# Patient Record
Sex: Male | Born: 1970 | Race: White | Hispanic: No | Marital: Married | Smoking: Never smoker
Health system: Southern US, Community
[De-identification: ages and names within clinical notes are randomized; demographics above are authoritative.]

## PROBLEM LIST (undated history)

## (undated) DIAGNOSIS — E785 Hyperlipidemia, unspecified: Secondary | ICD-10-CM

## (undated) DIAGNOSIS — F329 Major depressive disorder, single episode, unspecified: Secondary | ICD-10-CM

## (undated) DIAGNOSIS — F32A Depression, unspecified: Secondary | ICD-10-CM

## (undated) DIAGNOSIS — F419 Anxiety disorder, unspecified: Secondary | ICD-10-CM

## (undated) DIAGNOSIS — E119 Type 2 diabetes mellitus without complications: Secondary | ICD-10-CM

## (undated) DIAGNOSIS — G4730 Sleep apnea, unspecified: Secondary | ICD-10-CM

## (undated) DIAGNOSIS — K219 Gastro-esophageal reflux disease without esophagitis: Secondary | ICD-10-CM

## (undated) DIAGNOSIS — I10 Essential (primary) hypertension: Secondary | ICD-10-CM

## (undated) HISTORY — DX: Gastro-esophageal reflux disease without esophagitis: K21.9

## (undated) HISTORY — DX: Essential (primary) hypertension: I10

## (undated) HISTORY — DX: Anxiety disorder, unspecified: F41.9

## (undated) HISTORY — DX: Depression, unspecified: F32.A

## (undated) HISTORY — DX: Major depressive disorder, single episode, unspecified: F32.9

## (undated) HISTORY — DX: Type 2 diabetes mellitus without complications: E11.9

## (undated) HISTORY — DX: Hyperlipidemia, unspecified: E78.5

## (undated) HISTORY — PX: WISDOM TOOTH EXTRACTION: SHX21

## (undated) HISTORY — DX: Sleep apnea, unspecified: G47.30

---

## 1999-10-10 ENCOUNTER — Emergency Department (HOSPITAL_COMMUNITY): Admission: EM | Admit: 1999-10-10 | Discharge: 1999-10-11 | Payer: Self-pay | Admitting: Emergency Medicine

## 1999-11-01 ENCOUNTER — Encounter: Admission: RE | Admit: 1999-11-01 | Discharge: 2000-01-30 | Payer: Self-pay | Admitting: Family Medicine

## 1999-11-22 ENCOUNTER — Ambulatory Visit (HOSPITAL_COMMUNITY): Admission: RE | Admit: 1999-11-22 | Discharge: 1999-11-22 | Payer: Self-pay | Admitting: *Deleted

## 1999-11-22 ENCOUNTER — Encounter: Payer: Self-pay | Admitting: *Deleted

## 1999-11-29 ENCOUNTER — Ambulatory Visit (HOSPITAL_COMMUNITY): Admission: RE | Admit: 1999-11-29 | Discharge: 1999-11-29 | Payer: Self-pay | Admitting: *Deleted

## 2015-09-10 ENCOUNTER — Other Ambulatory Visit (HOSPITAL_COMMUNITY): Payer: Self-pay | Admitting: Surgery

## 2015-09-26 ENCOUNTER — Other Ambulatory Visit (HOSPITAL_COMMUNITY): Payer: Self-pay | Admitting: Surgery

## 2015-09-27 ENCOUNTER — Encounter: Payer: BLUE CROSS/BLUE SHIELD | Attending: Surgery | Admitting: Dietician

## 2015-09-27 ENCOUNTER — Encounter: Payer: Self-pay | Admitting: Dietician

## 2015-09-27 DIAGNOSIS — Z01818 Encounter for other preprocedural examination: Secondary | ICD-10-CM | POA: Insufficient documentation

## 2015-09-27 NOTE — Progress Notes (Signed)
  Pre-Op Assessment Visit:  Pre-Operative Sleeve gastrectomy Surgery  Medical Nutrition Therapy:  Appt start time: 0920   End time:  1005.  Patient was seen on 09/27/2015 for Pre-Operative Nutrition Assessment. Assessment and letter of approval [facility] to [facility] coordinator on 09/27/2015.   Preferred Learning Style:   No preference indicated   Learning Readiness:   Ready  Handouts given during visit include:  Pre-Op Goals Bariatric Surgery Protein Shakes   During the appointment today the following Pre-Op Goals were reviewed with the patient: Maintain or lose weight as instructed by your surgeon Make healthy food choices Begin to limit portion sizes Limited concentrated sugars and fried foods Keep fat/sugar in the single digits per serving on   food labels Practice CHEWING your food  (aim for 30 chews per bite or until applesauce consistency) Practice not drinking 15 minutes before, during, and 30 minutes after each meal/snack Avoid all carbonated beverages  Avoid/limit caffeinated beverages  Avoid all sugar-sweetened beverages Consume 3 meals per day; eat every 3-5 hours Make a list of non-food related activities Aim for 64-100 ounces of FLUID daily  Aim for at least 60-80 grams of PROTEIN daily Look for a liquid protein source that contain ?15 g protein and ?5 g carbohydrate  (ex: shakes, drinks, shots)   Demonstrated degree of understanding via:  Teach Back  Teaching Method Utilized:  Visual Auditory Hands on  Barriers to learning/adherence to lifestyle change: none  Patient to call the [facility] to enroll in Pre-Op and Post-Op Nutrition Education when surgery date is scheduled.

## 2015-10-16 ENCOUNTER — Ambulatory Visit (HOSPITAL_COMMUNITY): Payer: BLUE CROSS/BLUE SHIELD

## 2015-10-16 ENCOUNTER — Ambulatory Visit (HOSPITAL_COMMUNITY)
Admission: RE | Admit: 2015-10-16 | Discharge: 2015-10-16 | Disposition: A | Discharge status: Home / Self Care | Payer: BLUE CROSS/BLUE SHIELD | Source: Ambulatory Visit | Attending: Surgery | Admitting: Surgery

## 2015-10-22 ENCOUNTER — Encounter: Payer: BLUE CROSS/BLUE SHIELD | Attending: Surgery

## 2015-10-22 DIAGNOSIS — Z01818 Encounter for other preprocedural examination: Secondary | ICD-10-CM | POA: Diagnosis not present

## 2015-10-24 NOTE — Progress Notes (Signed)
  Pre-Operative Nutrition Class:  Appt start time: 0786   End time:  1830.  Patient was seen on 10/22/2015 for Pre-Operative Bariatric Surgery Education at the [facility].   Surgery date: 11/12/2015 Surgery type: Sleeve gastrectomy Start weight at [facility]: 323 lbs on 09/27/2015 Weight today: 324.8 lbs  TANITA  BODY COMP RESULTS  10/22/15   BMI (kg/m^2) 46.6   Fat Mass (lbs) 144.8   Fat Free Mass (lbs) 180   Total Body Water (lbs) 143.2   Samples given per MNT protocol. Patient educated on appropriate usage: Bariatric Advantage Calcium Citrate chew (orange - qty 1) Lot #: [id number] Exp: 12/2015  Celebrate Vitamins Multivitamin (pineapple-strawberry - qty 1) Lot #: [id number] Exp: 04/2016  Premier protein shake (chocolate - qty 1) Lot #: [id number] Exp: 11/2015  Unjury Protein Powder (strawberry - qty 1) Lot #: [id number] Exp: 03/2016  The following the learning objectives were met by the patient during this course:  Identify Pre-Op Dietary Goals and will begin 2 weeks pre-operatively  Identify appropriate sources of fluids and proteins   State protein recommendations and appropriate sources pre and post-operatively  Identify Post-Operative Dietary Goals and will follow for 2 weeks post-operatively  Identify appropriate multivitamin and calcium sources  Describe the need for physical activity post-operatively and will follow MD recommendations  State when to call healthcare provider regarding medication questions or post-operative complications  Handouts given during class include:  Pre-Op Bariatric Surgery Diet Handout  Protein Shake Handout  Post-Op Bariatric Surgery Nutrition Handout  BELT Program Information Flyer  Support Group Information Flyer  [facility] Bariatric Supplements Price List  Follow-Up Plan: Patient will follow-up at [facility] 2 weeks post operatively for diet advancement per MD.

## 2015-11-06 ENCOUNTER — Ambulatory Visit: Payer: Self-pay | Admitting: Surgery

## 2015-11-08 ENCOUNTER — Ambulatory Visit: Payer: Self-pay | Admitting: Surgery

## 2015-11-08 ENCOUNTER — Encounter (HOSPITAL_COMMUNITY): Payer: Self-pay

## 2015-11-08 ENCOUNTER — Encounter (HOSPITAL_COMMUNITY)
Admission: RE | Admit: 2015-11-08 | Discharge: 2015-11-08 | Disposition: A | Discharge status: Home / Self Care | Payer: BLUE CROSS/BLUE SHIELD | Source: Ambulatory Visit | Attending: Surgery | Admitting: Surgery

## 2015-11-08 DIAGNOSIS — I10 Essential (primary) hypertension: Secondary | ICD-10-CM | POA: Diagnosis not present

## 2015-11-08 DIAGNOSIS — Z01818 Encounter for other preprocedural examination: Secondary | ICD-10-CM | POA: Insufficient documentation

## 2015-11-08 DIAGNOSIS — Z01812 Encounter for preprocedural laboratory examination: Secondary | ICD-10-CM | POA: Diagnosis not present

## 2015-11-08 DIAGNOSIS — Z6841 Body Mass Index (BMI) 40.0 and over, adult: Secondary | ICD-10-CM | POA: Insufficient documentation

## 2015-11-08 LAB — CBC WITH DIFFERENTIAL/PLATELET
BASOS ABS: 0 10*3/uL (ref 0.0–0.1)
Basophils Relative: 0 %
EOS PCT: 1 %
Eosinophils Absolute: 0.1 10*3/uL (ref 0.0–0.7)
HCT: 44.3 % (ref 39.0–52.0)
Hemoglobin: 15.2 g/dL (ref 13.0–17.0)
LYMPHS PCT: 28 %
Lymphs Abs: 1.8 10*3/uL (ref 0.7–4.0)
MCH: 30.8 pg (ref 26.0–34.0)
MCHC: 34.3 g/dL (ref 30.0–36.0)
MCV: 89.9 fL (ref 78.0–100.0)
Monocytes Absolute: 0.9 10*3/uL (ref 0.1–1.0)
Monocytes Relative: 14 %
NEUTROS PCT: 57 %
Neutro Abs: 3.7 10*3/uL (ref 1.7–7.7)
PLATELETS: 221 10*3/uL (ref 150–400)
RBC: 4.93 MIL/uL (ref 4.22–5.81)
RDW: 12.5 % (ref 11.5–15.5)
WBC: 6.5 10*3/uL (ref 4.0–10.5)

## 2015-11-08 NOTE — Patient Instructions (Addendum)
Martin Fox  11/08/2015   Your procedure is scheduled on: Monday 11/12/2015  Report to [facility]  Entrance take [name]  elevators to 3rd floor to  Short Stay Center at  0515  AM.  Call this number if you have problems the morning of surgery [phone]   Remember: ONLY 1 PERSON MAY GO WITH YOU TO SHORT STAY TO GET  READY MORNING OF YOUR SURGERY.   Do not eat food or drink liquids :After Midnight.     Take these medicines the morning of surgery with A SIP OF WATER: Lorazepam if needed              BRING CPAP MASK AND TUBING TO HOSPITAL MORNING OF SURGERY!                               You may not have any metal on your body including hair pins and              piercings  Do not wear jewelry, make-up, lotions, powders or perfumes, deodorant                          Men may shave face and neck.   Do not bring valuables to the hospital. [facility] IS NOT             RESPONSIBLE   FOR VALUABLES.  Contacts, dentures or bridgework may not be worn into surgery.  Leave suitcase in the car. After surgery it may be brought to your room.                   Please read over the following fact sheets you were given: _____________________________________________________________________             [facility] - Preparing for Surgery Before surgery, you can play an important role.  Because skin is not sterile, your skin needs to be as free of germs as possible.  You can reduce the number of germs on your skin by washing with CHG (chlorahexidine gluconate) soap before surgery.  CHG is an antiseptic cleaner which kills germs and bonds with the skin to continue killing germs even after washing. Please DO NOT use if you have an allergy to CHG or antibacterial soaps.  If your skin becomes reddened/irritated stop using the CHG and inform your nurse when you arrive at Short Stay. Do not shave (including legs and underarms) for at least 48 hours prior to the first CHG  shower.  You may shave your face/neck. Please follow these instructions carefully:  1.  Shower with CHG Soap the night before surgery and the  morning of Surgery.  2.  If you choose to wash your hair, wash your hair first as usual with your  normal  shampoo.  3.  After you shampoo, rinse your hair and body thoroughly to remove the  shampoo.                           4.  Use CHG as you would any other liquid soap.  You can apply chg directly  to the skin and wash  Gently with a scrungie or clean washcloth.  5.  Apply the CHG Soap to your body ONLY FROM THE NECK DOWN.   Do not use on face/ open                           Wound or open sores. Avoid contact with eyes, ears mouth and genitals (private parts).                       Wash face,  Genitals (private parts) with your normal soap.             6.  Wash thoroughly, paying special attention to the area where your surgery  will be performed.  7.  Thoroughly rinse your body with warm water from the neck down.  8.  DO NOT shower/wash with your normal soap after using and rinsing off  the CHG Soap.                9.  Pat yourself dry with a clean towel.            10.  Wear clean pajamas.            11.  Place clean sheets on your bed the night of your first shower and do not  sleep with pets. Day of Surgery : Do not apply any lotions/deodorants the morning of surgery.  Please wear clean clothes to the hospital/surgery center.  FAILURE TO FOLLOW THESE INSTRUCTIONS MAY RESULT IN THE CANCELLATION OF YOUR SURGERY PATIENT SIGNATURE_________________________________  NURSE SIGNATURE__________________________________  ________________________________________________________________________   Martin Fox  An incentive spirometer is a tool that can help keep your lungs clear and active. This tool measures how well you are filling your lungs with each breath. Taking long deep breaths may help reverse or decrease the chance  of developing breathing (pulmonary) problems (especially infection) following:  A long period of time when you are unable to move or be active. BEFORE THE PROCEDURE   If the spirometer includes an indicator to show your best effort, your nurse or respiratory therapist will set it to a desired goal.  If possible, sit up straight or lean slightly forward. Try not to slouch.  Hold the incentive spirometer in an upright position. INSTRUCTIONS FOR USE  1. Sit on the edge of your bed if possible, or sit up as far as you can in bed or on a chair. 2. Hold the incentive spirometer in an upright position. 3. Breathe out normally. 4. Place the mouthpiece in your mouth and seal your lips tightly around it. 5. Breathe in slowly and as deeply as possible, raising the piston or the ball toward the top of the column. 6. Hold your breath for 3-5 seconds or for as long as possible. Allow the piston or ball to fall to the bottom of the column. 7. Remove the mouthpiece from your mouth and breathe out normally. 8. Rest for a few seconds and repeat Steps 1 through 7 at least 10 times every 1-2 hours when you are awake. Take your time and take a few normal breaths between deep breaths. 9. The spirometer may include an indicator to show your best effort. Use the indicator as a goal to work toward during each repetition. 10. After each set of 10 deep breaths, practice coughing to be sure your lungs are clear. If you have an incision (the cut made at the time of surgery),  support your incision when coughing by placing a pillow or rolled up towels firmly against it. Once you are able to get out of bed, walk around indoors and cough well. You may stop using the incentive spirometer when instructed by your caregiver.  RISKS AND COMPLICATIONS  Take your time so you do not get dizzy or light-headed.  If you are in pain, you may need to take or ask for pain medication before doing incentive spirometry. It is harder to  take a deep breath if you are having pain. AFTER USE  Rest and breathe slowly and easily.  It can be helpful to keep track of a log of your progress. Your caregiver can provide you with a simple table to help with this. If you are using the spirometer at home, follow these instructions: [place] IF:   You are having difficultly using the spirometer.  You have trouble using the spirometer as often as instructed.  Your pain medication is not giving enough relief while using the spirometer.  You develop fever of 100.5 F (38.1 C) or higher. SEEK IMMEDIATE MEDICAL CARE IF:   You cough up bloody sputum that had not been present before.  You develop fever of 102 F (38.9 C) or greater.  You develop worsening pain at or near the incision site. MAKE SURE YOU:   Understand these instructions.  Will watch your condition.  Will get help right away if you are not doing well or get worse. Document Released: 09/08/2006 Document Revised: 07/21/2011 Document Reviewed: 11/09/2006 [facility] Patient Information 2014 [place], [place].   ________________________________________________________________________

## 2015-11-08 NOTE — Progress Notes (Signed)
11/06/2015- [facility] on chart (HgA1C, CMP, Lipid panel).

## 2015-11-08 NOTE — H&P (Signed)
Truddie CrumbleAshley D. Kuwahara Location: [facility] Patient #: [id number] DOB: 09/23/1970 Married / Language: English / Race: White Male  CC Morbid obesity  History of Present Illness  The patient is a 45 year old male who presents for a bariatric surgery evaluation. [name] and [name] came in today for a consultation. They have been to our seminar and were considering a sleeve and a bypass. We discussed sleeve gastrectomy and Roux-en-Y gastric bypass. He does drink liquids a lot when working. He also takes NSAIDS. He is a large man with centripetal obesity and a BMI of 46. They asked very good questions we had a long discussion about diet and lifestyle changes to have optimal success. [place] They have 3 sons.  He has tried a number of diets and always regains weight and has become very discouraged about that. Based on thorough discussion of his habits I think a sleeve gastrectomy would be best for him and he agrees. We will go ahead and pursue this.  His UGI was negative for hiatus hernia.  He was seen on the office June 29th for a preop visit.     Other Problems  Anxiety Disorder Back Pain Depression Diabetes Mellitus Diverticulosis Gastroesophageal Reflux Disease High blood pressure Hypercholesterolemia Migraine Headache Other disease, cancer, significant illness Sleep Apnea  Past Surgical History  Oral Surgery  Diagnostic Studies History  Colonoscopy >10 years ago  Allergies  No Known Drug Allergies04/27/2017  Social History  Alcohol use Occasional alcohol use. Caffeine use Carbonated beverages, Coffee, Tea. No drug use Tobacco use Never smoker.  Family History  Arthritis Mother. Migraine Headache Mother.    Review of Systems  General Present- Weight Gain. Not Present- Appetite Loss, Chills, Fatigue, Fever, Night Sweats and Weight Loss. HEENT Not Present- Earache, Hearing Loss, Hoarseness, Nose  Bleed, Oral Ulcers, Ringing in the Ears, Seasonal Allergies, Sinus Pain, Sore Throat, Visual Disturbances, Wears glasses/contact lenses and Yellow Eyes. Respiratory Present- Chronic Cough. Not Present- Bloody sputum, Difficulty Breathing, Snoring and Wheezing. Breast Not Present- Breast Mass, Breast Pain, Nipple Discharge and Skin Changes. Cardiovascular Not Present- Chest Pain, Difficulty Breathing Lying Down, Leg Cramps, Palpitations, Rapid Heart Rate, Shortness of Breath and Swelling of Extremities. Gastrointestinal Not Present- Abdominal Pain, Bloating, Bloody Stool, Change in Bowel Habits, Chronic diarrhea, Constipation, Difficulty Swallowing, Excessive gas, Gets full quickly at meals, Hemorrhoids, Indigestion, Nausea, Rectal Pain and Vomiting. Male Genitourinary Present- Frequency. Not Present- Blood in Urine, Change in Urinary Stream, Impotence, Nocturia, Painful Urination, Urgency and Urine Leakage. Musculoskeletal Present- Back Pain and Joint Stiffness. Not Present- Joint Pain, Muscle Pain, Muscle Weakness and Swelling of Extremities. Neurological Present- Decreased Memory. Not Present- Fainting, Headaches, Numbness, Seizures, Tingling, Tremor, Trouble walking and Weakness. Psychiatric Present- Anxiety. Not Present- Bipolar, Change in Sleep Pattern, Depression, Fearful and Frequent crying. Endocrine Present- New Diabetes. Not Present- Cold Intolerance, Excessive Hunger, Hair Changes, Heat Intolerance and Hot flashes.  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  Will proceed with lap sleeve gastrectomy.    Matt B. Daphine DeutscherMartin, MD, FACS

## 2015-11-12 ENCOUNTER — Encounter (HOSPITAL_COMMUNITY)
Admission: RE | Disposition: A | Discharge status: Home / Self Care | Payer: Self-pay | Source: Ambulatory Visit | Attending: Surgery

## 2015-11-12 ENCOUNTER — Inpatient Hospital Stay (HOSPITAL_COMMUNITY): Payer: BLUE CROSS/BLUE SHIELD | Admitting: Registered Nurse

## 2015-11-12 ENCOUNTER — Encounter (HOSPITAL_COMMUNITY): Payer: Self-pay | Admitting: *Deleted

## 2015-11-12 ENCOUNTER — Inpatient Hospital Stay (HOSPITAL_COMMUNITY)
Admission: RE | Admit: 2015-11-12 | Discharge: 2015-11-14 | DRG: 621 | Disposition: A | Discharge status: Home / Self Care | Payer: BLUE CROSS/BLUE SHIELD | Source: Ambulatory Visit | Attending: Surgery | Admitting: Surgery

## 2015-11-12 DIAGNOSIS — Z79899 Other long term (current) drug therapy: Secondary | ICD-10-CM

## 2015-11-12 DIAGNOSIS — F419 Anxiety disorder, unspecified: Secondary | ICD-10-CM | POA: Diagnosis present

## 2015-11-12 DIAGNOSIS — I10 Essential (primary) hypertension: Secondary | ICD-10-CM | POA: Diagnosis present

## 2015-11-12 DIAGNOSIS — F329 Major depressive disorder, single episode, unspecified: Secondary | ICD-10-CM | POA: Diagnosis present

## 2015-11-12 DIAGNOSIS — G4730 Sleep apnea, unspecified: Secondary | ICD-10-CM | POA: Diagnosis present

## 2015-11-12 DIAGNOSIS — E119 Type 2 diabetes mellitus without complications: Secondary | ICD-10-CM | POA: Diagnosis present

## 2015-11-12 DIAGNOSIS — Z6841 Body Mass Index (BMI) 40.0 and over, adult: Secondary | ICD-10-CM

## 2015-11-12 DIAGNOSIS — E118 Type 2 diabetes mellitus with unspecified complications: Secondary | ICD-10-CM | POA: Diagnosis present

## 2015-11-12 DIAGNOSIS — E7800 Pure hypercholesterolemia, unspecified: Secondary | ICD-10-CM | POA: Diagnosis present

## 2015-11-12 DIAGNOSIS — K219 Gastro-esophageal reflux disease without esophagitis: Secondary | ICD-10-CM | POA: Diagnosis present

## 2015-11-12 DIAGNOSIS — Z7984 Long term (current) use of oral hypoglycemic drugs: Secondary | ICD-10-CM

## 2015-11-12 DIAGNOSIS — Z9884 Bariatric surgery status: Secondary | ICD-10-CM

## 2015-11-12 DIAGNOSIS — K811 Chronic cholecystitis: Secondary | ICD-10-CM | POA: Diagnosis present

## 2015-11-12 HISTORY — PX: LAPAROSCOPIC GASTRIC SLEEVE RESECTION: SHX5895

## 2015-11-12 LAB — CBC
HEMATOCRIT: 42.4 % (ref 39.0–52.0)
HEMOGLOBIN: 14.4 g/dL (ref 13.0–17.0)
MCH: 30.7 pg (ref 26.0–34.0)
MCHC: 34 g/dL (ref 30.0–36.0)
MCV: 90.4 fL (ref 78.0–100.0)
Platelets: 188 10*3/uL (ref 150–400)
RBC: 4.69 MIL/uL (ref 4.22–5.81)
RDW: 12.6 % (ref 11.5–15.5)
WBC: 10.8 10*3/uL — ABNORMAL HIGH (ref 4.0–10.5)

## 2015-11-12 LAB — GLUCOSE, CAPILLARY: Glucose-Capillary: 177 mg/dL — ABNORMAL HIGH (ref 65–99)

## 2015-11-12 LAB — CREATININE, SERUM
Creatinine, Ser: 1.74 mg/dL — ABNORMAL HIGH (ref 0.61–1.24)
GFR calc Af Amer: 54 mL/min — ABNORMAL LOW (ref >60)
GFR, EST NON AFRICAN AMERICAN: 47 mL/min — AB (ref >60)

## 2015-11-12 LAB — HEMOGLOBIN AND HEMATOCRIT, BLOOD
HCT: 42.6 % (ref 39.0–52.0)
HEMOGLOBIN: 14.4 g/dL (ref 13.0–17.0)

## 2015-11-12 SURGERY — GASTRECTOMY, SLEEVE, LAPAROSCOPIC
Anesthesia: General

## 2015-11-12 MED ORDER — FENTANYL CITRATE (PF) 100 MCG/2ML IJ SOLN
INTRAMUSCULAR | Status: AC
  Filled 2015-11-12: qty 2

## 2015-11-12 MED ORDER — ACETAMINOPHEN 10 MG/ML IV SOLN
1000.0000 mg | Freq: Four times a day (QID) | INTRAVENOUS | Status: AC
  Administered 2015-11-12 – 2015-11-13 (×4): 1000 mg via INTRAVENOUS
  Filled 2015-11-12 (×5): qty 100

## 2015-11-12 MED ORDER — ROCURONIUM BROMIDE 100 MG/10ML IV SOLN
INTRAVENOUS | Status: DC | PRN
  Administered 2015-11-12 (×3): 10 mg via INTRAVENOUS
  Administered 2015-11-12: 40 mg via INTRAVENOUS
  Administered 2015-11-12 (×2): 10 mg via INTRAVENOUS

## 2015-11-12 MED ORDER — LIDOCAINE HCL (CARDIAC) 20 MG/ML IV SOLN
INTRAVENOUS | Status: AC
  Filled 2015-11-12: qty 5

## 2015-11-12 MED ORDER — SUCCINYLCHOLINE CHLORIDE 20 MG/ML IJ SOLN
INTRAMUSCULAR | Status: DC | PRN
  Administered 2015-11-12: 100 mg via INTRAVENOUS

## 2015-11-12 MED ORDER — DEXTROSE 5 % IV SOLN
INTRAVENOUS | Status: AC
  Filled 2015-11-12: qty 2

## 2015-11-12 MED ORDER — 0.9 % SODIUM CHLORIDE (POUR BTL) OPTIME
TOPICAL | Status: DC | PRN
  Administered 2015-11-12: 1000 mL

## 2015-11-12 MED ORDER — CHLORHEXIDINE GLUCONATE CLOTH 2 % EX PADS
6.0000 | MEDICATED_PAD | Freq: Once | CUTANEOUS | Status: DC
Start: 2015-11-12 — End: 2015-11-12

## 2015-11-12 MED ORDER — EPHEDRINE SULFATE 50 MG/ML IJ SOLN
INTRAMUSCULAR | Status: DC | PRN
  Administered 2015-11-12 (×2): 5 mg via INTRAVENOUS

## 2015-11-12 MED ORDER — EPHEDRINE SULFATE 50 MG/ML IJ SOLN
INTRAMUSCULAR | Status: AC
  Filled 2015-11-12: qty 1

## 2015-11-12 MED ORDER — LACTATED RINGERS IR SOLN
Status: DC | PRN
  Administered 2015-11-12: 1

## 2015-11-12 MED ORDER — HYDROMORPHONE HCL 1 MG/ML IJ SOLN
INTRAMUSCULAR | Status: DC | PRN
  Administered 2015-11-12 (×2): 0.5 mg via INTRAVENOUS

## 2015-11-12 MED ORDER — BUPIVACAINE LIPOSOME 1.3 % IJ SUSP
20.0000 mL | Freq: Once | INTRAMUSCULAR | Status: AC
  Administered 2015-11-12: 20 mL
  Filled 2015-11-12: qty 20

## 2015-11-12 MED ORDER — SUGAMMADEX SODIUM 500 MG/5ML IV SOLN
INTRAVENOUS | Status: DC | PRN
  Administered 2015-11-12: 280 mg via INTRAVENOUS

## 2015-11-12 MED ORDER — PROPOFOL 10 MG/ML IV BOLUS
INTRAVENOUS | Status: DC | PRN
  Administered 2015-11-12: 300 mg via INTRAVENOUS

## 2015-11-12 MED ORDER — DEXTROSE 5 % IV SOLN
2.0000 g | INTRAVENOUS | Status: AC
  Administered 2015-11-12 (×2): 2 g via INTRAVENOUS

## 2015-11-12 MED ORDER — ATROPINE SULFATE 0.4 MG/ML IJ SOLN
INTRAMUSCULAR | Status: AC
  Filled 2015-11-12: qty 2

## 2015-11-12 MED ORDER — ACETAMINOPHEN 160 MG/5ML PO SOLN
650.0000 mg | ORAL | Status: DC | PRN
Start: 2015-11-13 — End: 2015-11-14

## 2015-11-12 MED ORDER — DEXAMETHASONE SODIUM PHOSPHATE 10 MG/ML IJ SOLN
INTRAMUSCULAR | Status: DC | PRN
  Administered 2015-11-12: 10 mg via INTRAVENOUS

## 2015-11-12 MED ORDER — ACETAMINOPHEN 160 MG/5ML PO SOLN: 325.0000 mg | ORAL | Status: DC | PRN

## 2015-11-12 MED ORDER — ONDANSETRON HCL 4 MG/2ML IJ SOLN
4.0000 mg | Freq: Once | INTRAMUSCULAR | Status: DC | PRN
Start: 2015-11-12 — End: 2015-11-12

## 2015-11-12 MED ORDER — HYDROMORPHONE HCL 2 MG/ML IJ SOLN
INTRAMUSCULAR | Status: AC
  Filled 2015-11-12: qty 1

## 2015-11-12 MED ORDER — ROCURONIUM BROMIDE 100 MG/10ML IV SOLN
INTRAVENOUS | Status: AC
  Filled 2015-11-12: qty 1

## 2015-11-12 MED ORDER — ONDANSETRON HCL 4 MG/2ML IJ SOLN
INTRAMUSCULAR | Status: AC
  Filled 2015-11-12: qty 2

## 2015-11-12 MED ORDER — MEPERIDINE HCL 50 MG/ML IJ SOLN: 6.2500 mg | INTRAMUSCULAR | Status: DC | PRN

## 2015-11-12 MED ORDER — HYDROMORPHONE HCL 1 MG/ML IJ SOLN
1.0000 mg | INTRAMUSCULAR | Status: DC | PRN
  Administered 2015-11-12 – 2015-11-14 (×18): 1 mg via INTRAVENOUS
  Filled 2015-11-12 (×19): qty 1

## 2015-11-12 MED ORDER — HEPARIN SODIUM (PORCINE) 5000 UNIT/ML IJ SOLN
5000.0000 [IU] | Freq: Three times a day (TID) | INTRAMUSCULAR | Status: DC
  Administered 2015-11-12 – 2015-11-14 (×5): 5000 [IU] via SUBCUTANEOUS
  Filled 2015-11-12 (×5): qty 1

## 2015-11-12 MED ORDER — CETYLPYRIDINIUM CHLORIDE 0.05 % MT LIQD
7.0000 mL | Freq: Two times a day (BID) | OROMUCOSAL | Status: DC
  Administered 2015-11-12 – 2015-11-13 (×4): 7 mL via OROMUCOSAL

## 2015-11-12 MED ORDER — ONDANSETRON HCL 4 MG/2ML IJ SOLN
INTRAMUSCULAR | Status: DC | PRN
  Administered 2015-11-12: 4 mg via INTRAVENOUS

## 2015-11-12 MED ORDER — CEFOXITIN SODIUM 2 G IV SOLR
INTRAVENOUS | Status: AC
  Filled 2015-11-12: qty 2

## 2015-11-12 MED ORDER — FENTANYL CITRATE (PF) 100 MCG/2ML IJ SOLN
INTRAMUSCULAR | Status: DC | PRN
  Administered 2015-11-12: 100 ug via INTRAVENOUS
  Administered 2015-11-12 (×3): 50 ug via INTRAVENOUS

## 2015-11-12 MED ORDER — MIDAZOLAM HCL 2 MG/2ML IJ SOLN
INTRAMUSCULAR | Status: AC
  Filled 2015-11-12: qty 2

## 2015-11-12 MED ORDER — OXYCODONE HCL 5 MG/5ML PO SOLN
5.0000 mg | ORAL | Status: DC | PRN
  Administered 2015-11-13 – 2015-11-14 (×6): 10 mg via ORAL
  Filled 2015-11-12 (×6): qty 10

## 2015-11-12 MED ORDER — DEXAMETHASONE SODIUM PHOSPHATE 10 MG/ML IJ SOLN
INTRAMUSCULAR | Status: AC
  Filled 2015-11-12: qty 1

## 2015-11-12 MED ORDER — FENTANYL CITRATE (PF) 250 MCG/5ML IJ SOLN
INTRAMUSCULAR | Status: AC
  Filled 2015-11-12: qty 5

## 2015-11-12 MED ORDER — KCL IN DEXTROSE-NACL 20-5-0.45 MEQ/L-%-% IV SOLN
INTRAVENOUS | Status: DC
  Administered 2015-11-12: 100 mL/h via INTRAVENOUS
  Administered 2015-11-12 – 2015-11-13 (×2): via INTRAVENOUS
  Filled 2015-11-12 (×6): qty 1000

## 2015-11-12 MED ORDER — PROPOFOL 10 MG/ML IV BOLUS
INTRAVENOUS | Status: AC
  Filled 2015-11-12: qty 40

## 2015-11-12 MED ORDER — FENTANYL CITRATE (PF) 100 MCG/2ML IJ SOLN
25.0000 ug | INTRAMUSCULAR | Status: DC | PRN
  Administered 2015-11-12 (×4): 50 ug via INTRAVENOUS

## 2015-11-12 MED ORDER — SODIUM CHLORIDE 0.9 % IJ SOLN
INTRAMUSCULAR | Status: DC | PRN
  Administered 2015-11-12: 30 mL

## 2015-11-12 MED ORDER — AMLODIPINE BESYLATE 5 MG PO TABS
5.0000 mg | ORAL_TABLET | Freq: Once | ORAL | Status: DC
  Filled 2015-11-12: qty 1

## 2015-11-12 MED ORDER — MIDAZOLAM HCL 2 MG/2ML IJ SOLN
1.0000 mg | Freq: Once | INTRAMUSCULAR | Status: AC | PRN
  Administered 2015-11-12 (×2): 1 mg via INTRAVENOUS

## 2015-11-12 MED ORDER — PREMIER PROTEIN SHAKE
2.0000 [oz_av] | ORAL | Status: DC
  Administered 2015-11-14 (×2): 2 [oz_av] via ORAL
  Filled 2015-11-12: qty 325.31

## 2015-11-12 MED ORDER — HEPARIN SODIUM (PORCINE) 5000 UNIT/ML IJ SOLN
5000.0000 [IU] | INTRAMUSCULAR | Status: AC
  Administered 2015-11-12: 5000 [IU] via SUBCUTANEOUS
  Filled 2015-11-12: qty 1

## 2015-11-12 MED ORDER — HYDROMORPHONE HCL 1 MG/ML IJ SOLN
1.0000 mg | INTRAMUSCULAR | Status: DC | PRN
  Administered 2015-11-12 (×3): 1 mg via INTRAVENOUS
  Filled 2015-11-12 (×3): qty 1

## 2015-11-12 MED ORDER — ONDANSETRON HCL 4 MG/2ML IJ SOLN
4.0000 mg | INTRAMUSCULAR | Status: DC | PRN
  Administered 2015-11-12 – 2015-11-13 (×3): 4 mg via INTRAVENOUS
  Filled 2015-11-12 (×4): qty 2

## 2015-11-12 MED ORDER — LACTATED RINGERS IV SOLN: INTRAVENOUS | Status: DC

## 2015-11-12 MED ORDER — SODIUM CHLORIDE 0.9 % IJ SOLN
INTRAMUSCULAR | Status: AC
  Filled 2015-11-12: qty 10

## 2015-11-12 MED ORDER — LACTATED RINGERS IV SOLN
INTRAVENOUS | Status: DC | PRN
  Administered 2015-11-12 (×2): via INTRAVENOUS

## 2015-11-12 MED ORDER — HYDROMORPHONE HCL 1 MG/ML IJ SOLN
0.5000 mg | INTRAMUSCULAR | Status: DC | PRN
  Administered 2015-11-12: 0.5 mg via INTRAVENOUS
  Filled 2015-11-12: qty 1

## 2015-11-12 MED ORDER — LIDOCAINE HCL (CARDIAC) 20 MG/ML IV SOLN
INTRAVENOUS | Status: DC | PRN
  Administered 2015-11-12: 100 mg via INTRAVENOUS

## 2015-11-12 MED ORDER — FAMOTIDINE IN NACL 20-0.9 MG/50ML-% IV SOLN
20.0000 mg | Freq: Two times a day (BID) | INTRAVENOUS | Status: DC
  Administered 2015-11-12 – 2015-11-13 (×4): 20 mg via INTRAVENOUS
  Filled 2015-11-12 (×5): qty 50

## 2015-11-12 MED ORDER — SODIUM CHLORIDE 0.9 % IJ SOLN
INTRAMUSCULAR | Status: AC
  Filled 2015-11-12: qty 50

## 2015-11-12 MED ORDER — SUGAMMADEX SODIUM 500 MG/5ML IV SOLN
INTRAVENOUS | Status: AC
  Filled 2015-11-12: qty 5

## 2015-11-12 MED ORDER — MIDAZOLAM HCL 5 MG/5ML IJ SOLN
INTRAMUSCULAR | Status: DC | PRN
  Administered 2015-11-12: 2 mg via INTRAVENOUS

## 2015-11-12 SURGICAL SUPPLY — 72 items
APL SRG 32X5 SNPLK LF DISP (MISCELLANEOUS)
APPLICATOR COTTON TIP 6IN STRL (MISCELLANEOUS) IMPLANT
APPLIER CLIP 5 13 M/L LIGAMAX5 (MISCELLANEOUS)
APPLIER CLIP ROT 10 11.4 M/L (STAPLE)
APPLIER CLIP ROT 13.4 12 LRG (CLIP)
APR CLP LRG 13.4X12 ROT 20 MLT (CLIP)
APR CLP MED LRG 11.4X10 (STAPLE)
APR CLP MED LRG 5 ANG JAW (MISCELLANEOUS)
BAG SPEC RTRVL LRG 6X4 10 (ENDOMECHANICALS) ×1
BLADE SURG 15 STRL LF DISP TIS (BLADE) ×1 IMPLANT
BLADE SURG 15 STRL SS (BLADE) ×3
CABLE HIGH FREQUENCY MONO STRZ (ELECTRODE) ×3 IMPLANT
CLIP APPLIE 5 13 M/L LIGAMAX5 (MISCELLANEOUS) IMPLANT
CLIP APPLIE ROT 10 11.4 M/L (STAPLE) IMPLANT
CLIP APPLIE ROT 13.4 12 LRG (CLIP) IMPLANT
COVER SURGICAL LIGHT HANDLE (MISCELLANEOUS) ×3 IMPLANT
DEVICE SUT QUICK LOAD TK 5 (STAPLE) IMPLANT
DEVICE SUT TI-KNOT TK 5X26 (MISCELLANEOUS) IMPLANT
DEVICE SUTURE ENDOST 10MM (ENDOMECHANICALS) IMPLANT
DEVICE TI KNOT TK5 (MISCELLANEOUS)
DEVICE TROCAR PUNCTURE CLOSURE (ENDOMECHANICALS) ×3 IMPLANT
DISSECTOR BLUNT TIP ENDO 5MM (MISCELLANEOUS) IMPLANT
ELECT REM PT RETURN 9FT ADLT (ELECTROSURGICAL) ×3
ELECTRODE REM PT RTRN 9FT ADLT (ELECTROSURGICAL) ×1 IMPLANT
GAUZE SPONGE 4X4 12PLY STRL (GAUZE/BANDAGES/DRESSINGS) IMPLANT
GLOVE BIOGEL M 8.0 STRL (GLOVE) ×3 IMPLANT
GOWN STRL REUS W/TWL XL LVL3 (GOWN DISPOSABLE) ×12 IMPLANT
HANDLE STAPLE EGIA 4 XL (STAPLE) ×3 IMPLANT
HOVERMATT SINGLE USE (MISCELLANEOUS) ×3 IMPLANT
KIT BASIN OR (CUSTOM PROCEDURE TRAY) ×3 IMPLANT
LIQUID BAND (GAUZE/BANDAGES/DRESSINGS) ×2 IMPLANT
MARKER SKIN DUAL TIP RULER LAB (MISCELLANEOUS) ×3 IMPLANT
NDL SPNL 22GX3.5 QUINCKE BK (NEEDLE) ×1 IMPLANT
NEEDLE SPNL 22GX3.5 QUINCKE BK (NEEDLE) ×3 IMPLANT
PACK UNIVERSAL I (CUSTOM PROCEDURE TRAY) ×3 IMPLANT
POUCH SPECIMEN RETRIEVAL 10MM (ENDOMECHANICALS) ×2 IMPLANT
QUICK LOAD TK 5 (STAPLE)
RELOAD EGIA 45 MED/THCK PURPLE (STAPLE) ×4 IMPLANT
RELOAD STAPLE 45 PURP MED/THCK (STAPLE) IMPLANT
RELOAD TRI 45 ART MED THCK BLK (STAPLE) ×3 IMPLANT
RELOAD TRI 45 ART MED THCK PUR (STAPLE) IMPLANT
RELOAD TRI 60 ART MED THCK BLK (STAPLE) ×3 IMPLANT
RELOAD TRI 60 ART MED THCK PUR (STAPLE) ×7 IMPLANT
SCISSORS LAP 5X45 EPIX DISP (ENDOMECHANICALS) ×2 IMPLANT
SEALANT SURGICAL APPL DUAL CAN (MISCELLANEOUS) IMPLANT
SET IRRIG TUBING LAPAROSCOPIC (IRRIGATION / IRRIGATOR) ×3 IMPLANT
SHEARS HARMONIC ACE PLUS 45CM (MISCELLANEOUS) ×3 IMPLANT
SLEEVE ADV FIXATION 5X100MM (TROCAR) ×6 IMPLANT
SLEEVE GASTRECTOMY 36FR VISIGI (MISCELLANEOUS) ×3 IMPLANT
SOLUTION ANTI FOG 6CC (MISCELLANEOUS) ×3 IMPLANT
SPONGE LAP 18X18 X RAY DECT (DISPOSABLE) ×3 IMPLANT
STAPLER VISISTAT 35W (STAPLE) ×3 IMPLANT
SUT SURGIDAC NAB ES-9 0 48 120 (SUTURE) IMPLANT
SUT VIC AB 0 BRD 54 (SUTURE) ×2 IMPLANT
SUT VIC AB 4-0 SH 18 (SUTURE) ×3 IMPLANT
SUT VICRYL 0 TIES 12 18 (SUTURE) ×3 IMPLANT
SYR 10ML ECCENTRIC (SYRINGE) ×3 IMPLANT
SYR 20CC LL (SYRINGE) ×3 IMPLANT
SYR 50ML LL SCALE MARK (SYRINGE) ×3 IMPLANT
TOWEL OR 17X26 10 PK STRL BLUE (TOWEL DISPOSABLE) ×6 IMPLANT
TOWEL OR NON WOVEN STRL DISP B (DISPOSABLE) ×3 IMPLANT
TRAY FOLEY W/METER SILVER 14FR (SET/KITS/TRAYS/PACK) IMPLANT
TRAY FOLEY W/METER SILVER 16FR (SET/KITS/TRAYS/PACK) IMPLANT
TROCAR ADV FIXATION 12X100MM (TROCAR) ×3 IMPLANT
TROCAR ADV FIXATION 5X100MM (TROCAR) ×3 IMPLANT
TROCAR BLADELESS 15MM (ENDOMECHANICALS) ×3 IMPLANT
TROCAR BLADELESS OPT 5 100 (ENDOMECHANICALS) ×3 IMPLANT
TUBE CALIBRATION LAPBAND (TUBING) IMPLANT
TUBING CONNECTING 10 (TUBING) ×2 IMPLANT
TUBING CONNECTING 10' (TUBING) ×1
TUBING ENDO SMARTCAP (MISCELLANEOUS) ×3 IMPLANT
TUBING INSUF HEATED (TUBING) ×3 IMPLANT

## 2015-11-12 NOTE — Op Note (Signed)
Preoperative diagnosis: laparoscopic sleeve gastrectomy  Postoperative diagnosis: Same   Procedure: Upper endoscopy   Surgeon: Ireland Virrueta, M.D.  Anesthesia: Gen.   Indications for procedure: This patient was undergoing a laparoscopic sleeve gastrectomy.   Description of procedure: The endoscopy was placed in the mouth and into the oropharynx and under endoscopic vision it was advanced to the esophagogastric junction. The pouch was insufflated and no bleeding or bubbles were seen. The GEJ was identified at 44cm from the teeth. No bleeding or leaks were detected. The scope was withdrawn without difficulty.   Codie Krogh, M.D. General, Bariatric, & Minimally Invasive Surgery [facility]     

## 2015-11-12 NOTE — Progress Notes (Signed)
Pt states he wore cpap earlier today but the pressure was too much.  Cpap was set on 11cm h2o per home settings.  This RT adjusted cpap settings to autotitration mode 5-10cm h2o per pt comfort/request.  Pt states these settings feel much better.  RT will continue to monitor and assess pt as needed.

## 2015-11-12 NOTE — Anesthesia Procedure Notes (Signed)
Procedure Name: Intubation Date/Time: 11/12/2015 7:29 AM Performed by: Jarvis NewcomerARMISTEAD, Tyshauna Finkbiner A Pre-anesthesia Checklist: Patient identified, Emergency Drugs available, Suction available, Timeout performed and Patient being monitored Patient Re-evaluated:Patient Re-evaluated prior to inductionOxygen Delivery Method: Circle system utilized Preoxygenation: Pre-oxygenation with 100% oxygen Intubation Type: IV induction Ventilation: Mask ventilation without difficulty Laryngoscope Size: Mac and 4 Grade View: Grade I Tube type: Oral Tube size: 7.5 mm Number of attempts: 1 Airway Equipment and Method: Stylet Placement Confirmation: ETT inserted through vocal cords under direct vision,  positive ETCO2 and breath sounds checked- equal and bilateral Secured at: 23 cm Tube secured with: Tape Dental Injury: Teeth and Oropharynx as per pre-operative assessment

## 2015-11-12 NOTE — Progress Notes (Signed)
Patient complaining of pain medication not being effective. Daphine DeutscherMartin, MD paged. Orders changed to dilaudid IV 1 mg every 1 as needed.

## 2015-11-12 NOTE — H&P (View-Only) (Signed)
Martin Fox Location: [facility] Patient #: [id number] DOB: 09/23/1970 Married / Language: English / Race: White Male  CC Morbid obesity  History of Present Illness  The patient is a 45 year old male who presents for a bariatric surgery evaluation. [name] and [name] came in today for a consultation. They have been to our seminar and were considering a sleeve and a bypass. We discussed sleeve gastrectomy and Roux-en-Y gastric bypass. He does drink liquids a lot when working. He also takes NSAIDS. He is a large man with centripetal obesity and a BMI of 46. They asked very good questions we had a long discussion about diet and lifestyle changes to have optimal success. [place]. They have 3 sons.  He has tried a number of diets and always regains weight and has become very discouraged about that. Based on thorough discussion of his habits I think a sleeve gastrectomy would be best for him and he agrees. We will go ahead and pursue this.  His UGI was negative for hiatus hernia.  He was seen on the office June 29th for a preop visit.     Other Problems  Anxiety Disorder Back Pain Depression Diabetes Mellitus Diverticulosis Gastroesophageal Reflux Disease High blood pressure Hypercholesterolemia Migraine Headache Other disease, cancer, significant illness Sleep Apnea  Past Surgical History  Oral Surgery  Diagnostic Studies History  Colonoscopy >10 years ago  Allergies  No Known Drug Allergies04/27/2017  Social History  Alcohol use Occasional alcohol use. Caffeine use Carbonated beverages, Coffee, Tea. No drug use Tobacco use Never smoker.  Family History  Arthritis Mother. Migraine Headache Mother.    Review of Systems  General Present- Weight Gain. Not Present- Appetite Loss, Chills, Fatigue, Fever, Night Sweats and Weight Loss. HEENT Not Present- Earache, Hearing Loss, Hoarseness, Nose  Bleed, Oral Ulcers, Ringing in the Ears, Seasonal Allergies, Sinus Pain, Sore Throat, Visual Disturbances, Wears glasses/contact lenses and Yellow Eyes. Respiratory Present- Chronic Cough. Not Present- Bloody sputum, Difficulty Breathing, Snoring and Wheezing. Breast Not Present- Breast Mass, Breast Pain, Nipple Discharge and Skin Changes. Cardiovascular Not Present- Chest Pain, Difficulty Breathing Lying Down, Leg Cramps, Palpitations, Rapid Heart Rate, Shortness of Breath and Swelling of Extremities. Gastrointestinal Not Present- Abdominal Pain, Bloating, Bloody Stool, Change in Bowel Habits, Chronic diarrhea, Constipation, Difficulty Swallowing, Excessive gas, Gets full quickly at meals, Hemorrhoids, Indigestion, Nausea, Rectal Pain and Vomiting. Male Genitourinary Present- Frequency. Not Present- Blood in Urine, Change in Urinary Stream, Impotence, Nocturia, Painful Urination, Urgency and Urine Leakage. Musculoskeletal Present- Back Pain and Joint Stiffness. Not Present- Joint Pain, Muscle Pain, Muscle Weakness and Swelling of Extremities. Neurological Present- Decreased Memory. Not Present- Fainting, Headaches, Numbness, Seizures, Tingling, Tremor, Trouble walking and Weakness. Psychiatric Present- Anxiety. Not Present- Bipolar, Change in Sleep Pattern, Depression, Fearful and Frequent crying. Endocrine Present- New Diabetes. Not Present- Cold Intolerance, Excessive Hunger, Hair Changes, Heat Intolerance and Hot flashes.  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  Will proceed with lap sleeve gastrectomy.    Matt B. Hiroki Wint, MD, FACS  

## 2015-11-12 NOTE — Transfer of Care (Signed)
Immediate Anesthesia Transfer of Care Note  Patient: Martin Fox  Procedure(s) Performed: Procedure(s): LAPAROSCOPIC GASTRIC SLEEVE RESECTION WITH UPPER ENDO (N/A)  Patient Location: PACU  Anesthesia Type:General  Level of Consciousness: awake, alert , oriented and patient cooperative  Airway & Oxygen Therapy: Patient Spontanous Breathing and Patient connected to face mask oxygen  Post-op Assessment: Report given to RN, Post -op Vital signs reviewed and stable and Patient moving all extremities  Post vital signs: Reviewed and stable  Last Vitals:  Filed Vitals:   11/12/15 0509  BP: 116/77  Pulse: 68  Temp: 36.6 C  Resp: 18    Last Pain:  Filed Vitals:   11/12/15 0530  PainSc: 1          Complications: No apparent anesthesia complications

## 2015-11-12 NOTE — Anesthesia Postprocedure Evaluation (Signed)
Anesthesia Post Note  Patient: Martin Fox  Procedure(s) Performed: Procedure(s) (LRB): LAPAROSCOPIC GASTRIC SLEEVE RESECTION WITH UPPER ENDO (N/A)  Patient location during evaluation: PACU Anesthesia Type: General Level of consciousness: sedated Pain management: pain level controlled Vital Signs Assessment: post-procedure vital signs reviewed and stable Respiratory status: spontaneous breathing Cardiovascular status: stable Postop Assessment: no signs of nausea or vomiting Anesthetic complications: no     Last Vitals:  Filed Vitals:   11/12/15 1000 11/12/15 1015  BP: 153/95 147/105  Pulse: 85 80  Temp:    Resp: 11 15    Last Pain:  Filed Vitals:   11/12/15 1027  PainSc: 8    Pain Goal:                 Kord Monette JR,JOHN Haim Hansson

## 2015-11-12 NOTE — Anesthesia Preprocedure Evaluation (Addendum)
Anesthesia Evaluation  Patient identified by MRN, date of birth, ID band Patient awake    Reviewed: Allergy & Precautions, H&P , NPO status , Patient's Chart, lab work & pertinent test results  Airway Mallampati: I  TM Distance: >3 FB Neck ROM: full    Dental no notable dental hx.    Pulmonary  Severe. Uses CPAP of 11-14   Pulmonary exam normal        Cardiovascular Exercise Tolerance: Good hypertension, Pt. on medications Normal cardiovascular exam     Neuro/Psych negative neurological ROS     GI/Hepatic Neg liver ROS,   Endo/Other  diabetes, Type 2, Oral Hypoglycemic AgentsMorbid obesity  Renal/GU negative Renal ROS  negative genitourinary   Musculoskeletal   Abdominal (+) + obese,   Peds  Hematology negative hematology ROS (+)   Anesthesia Other Findings   Reproductive/Obstetrics negative OB ROS                          Anesthesia Physical Anesthesia Plan  ASA: III  Anesthesia Plan: General   Post-op Pain Management:    Induction: Intravenous  Airway Management Planned: Oral ETT  Additional Equipment:   Intra-op Plan:   Post-operative Plan: Extubation in OR  Informed Consent: I have reviewed the patients History and Physical, chart, labs and discussed the procedure including the risks, benefits and alternatives for the proposed anesthesia with the patient or authorized representative who has indicated his/her understanding and acceptance.   Dental Advisory Given  Plan Discussed with: CRNA and Surgeon  Anesthesia Plan Comments:         Anesthesia Quick Evaluation

## 2015-11-12 NOTE — Interval H&P Note (Signed)
History and Physical Interval Note:  11/12/2015 7:20 AM  Martin Fox  has presented today for surgery, with the diagnosis of Morbid Obesity  The various methods of treatment have been discussed with the patient and family. After consideration of risks, benefits and other options for treatment, the patient has consented to  Procedure(s): LAPAROSCOPIC GASTRIC SLEEVE RESECTION WITH UPPER ENDO (N/A) as a surgical intervention .  The patient's history has been reviewed, patient examined, no change in status, stable for surgery.  I have reviewed the patient's chart and labs.  Questions were answered to the patient's satisfaction.     Drayden Lukas B

## 2015-11-12 NOTE — Op Note (Signed)
Surgeon: Wenda LowMatt Isao Seltzer, MD, FACS  Asst:  Feliciana RossettiLuke Kinsinger, MD  Anes:  General endotracheal  Procedure: Laparoscopic sleeve gastrectomy and upper endoscopy  Diagnosis: Morbid obesity  Complications: none  EBL:   minimal cc  Description of Procedure:  The patient was take to OR 4 and given general anesthesia.  The abdomen was prepped with Technicare and draped sterilely.  A timeout was performed.  Access to the abdomen was achieved with a 5 mm Optiview through the left upper quadrant .  Following insufflation, the state of the abdomen was found to be free of adhesions.  The ViSiGi 36Fr tube was inserted to deflate the stomach and was pulled back into the esophagus.    The pylorus was identified and we measured 5 cm back and marked the antrum.  At that point we began dissection to take down the greater curvature of the stomach using the Harmonic scalpel.  This dissection was taken all the way up to the left crus.  Posterior attachments of the stomach were also taken down.    The ViSiGi tube was then passed into the antrum and suction applied so that it was snug along the lessor curvature.  The "crow's foot" or incisura was identified.  The sleeve gastrectomy was begun using the [employer] InternationalCovidien platform stapler beginning with a 4.5 cm and then a 6 cm black load with TRS.  This was followed by multiple applications of the 6 cm purple load with TRS.  When the sleeve was complete the tube was taken off suction and insufflated briefly.  The tube was withdrawn.  Upper endoscopy was then performed by Dr. Sheliah HatchKinsinger which showed no bleeding or evidence of leaking.     The specimen was extracted through the 15 trocar site.  The 15 was closed with a EFX shield and vicryl.   Wounds were infiltrated with Exparel and closed with 4-0 vicryl and Liquiban.    Martin B. Daphine DeutscherMartin, MD, [facility], [place] [phone]

## 2015-11-12 NOTE — Progress Notes (Signed)
Patient placed on CPAP +11 cmH2O with 3 L bleed in. Patient O2 sats were 100%. RT will continue to monitor patient.

## 2015-11-12 NOTE — Progress Notes (Signed)
Patient stated around 7:30 pm he had a episode where he called for staff.  Someone came to the door and he told them he felt pain in his abdomen but he stated no one came  after that for half hour.  He stated he did not call againt.  Nurse was not informed of this.  Patient had just be observed sleeping during change of shift sleeping with his CPAP  On.  This episode must have happen afterwards.  He later said he felt it was his CPAP giving him too much air because this happens at home also.  But he was concern.

## 2015-11-12 NOTE — Progress Notes (Signed)
Patient complaining of pain being a 10 out of 10 with no relief. Daphine DeutscherMartin, MD paged. New order placed for increased of diluadid IV to 1mg  every 2 hours as needed. With additional order for acetaminophen 1000 mg IV every 6hrs scheduled for 24 hours.

## 2015-11-13 LAB — HEMOGLOBIN AND HEMATOCRIT, BLOOD
HEMATOCRIT: 45.2 % (ref 39.0–52.0)
Hemoglobin: 15.4 g/dL (ref 13.0–17.0)

## 2015-11-13 LAB — CBC WITH DIFFERENTIAL/PLATELET
BASOS PCT: 0 %
Basophils Absolute: 0 10*3/uL (ref 0.0–0.1)
EOS ABS: 0 10*3/uL (ref 0.0–0.7)
EOS PCT: 0 %
HCT: 46.8 % (ref 39.0–52.0)
HEMOGLOBIN: 16 g/dL (ref 13.0–17.0)
Lymphocytes Relative: 7 %
Lymphs Abs: 0.7 10*3/uL (ref 0.7–4.0)
MCH: 30.6 pg (ref 26.0–34.0)
MCHC: 34.2 g/dL (ref 30.0–36.0)
MCV: 89.5 fL (ref 78.0–100.0)
MONOS PCT: 13 %
Monocytes Absolute: 1.5 10*3/uL — ABNORMAL HIGH (ref 0.1–1.0)
NEUTROS PCT: 80 %
Neutro Abs: 9.1 10*3/uL — ABNORMAL HIGH (ref 1.7–7.7)
Platelets: 206 10*3/uL (ref 150–400)
RBC: 5.23 MIL/uL (ref 4.22–5.81)
RDW: 12.5 % (ref 11.5–15.5)
WBC: 11.2 10*3/uL — AB (ref 4.0–10.5)

## 2015-11-13 MED ORDER — ALPRAZOLAM 0.5 MG PO TABS
0.5000 mg | ORAL_TABLET | Freq: Two times a day (BID) | ORAL | Status: DC | PRN
  Administered 2015-11-13 – 2015-11-14 (×3): 0.5 mg via ORAL
  Filled 2015-11-13 (×3): qty 1

## 2015-11-13 MED ORDER — AMLODIPINE BESYLATE-VALSARTAN 5-320 MG PO TABS: 1.0000 | ORAL_TABLET | Freq: Every day | ORAL | Status: DC

## 2015-11-13 MED ORDER — IRBESARTAN 150 MG PO TABS
300.0000 mg | ORAL_TABLET | Freq: Every day | ORAL | Status: DC
  Administered 2015-11-13: 300 mg via ORAL
  Filled 2015-11-13: qty 2

## 2015-11-13 MED ORDER — AMLODIPINE BESYLATE 5 MG PO TABS
5.0000 mg | ORAL_TABLET | Freq: Every day | ORAL | Status: DC
  Administered 2015-11-13: 5 mg via ORAL
  Filled 2015-11-13: qty 1

## 2015-11-13 NOTE — Progress Notes (Signed)
Notified Dr Daphine DeutscherMartin that Mr. Martin Fox is very anxious and c/o pain.  Was ordered give small sips of water and if tolerated he may take .5 mg of xanax PO.  Patient tolerated 30cc of water well at this time. Will give xanax and monitor.

## 2015-11-13 NOTE — Progress Notes (Signed)
Patient ID: Martin Fox, male   DOB: 02/19/1971, 45 y.o.   MRN: [id number] 1 Day Post-Op  Subjective: Had quite a bit of upper abdominal pain last night. Got better after belching and feels much better after by mouth Xanax. This morning states he is pretty comfortable. Denies nausea. Has been up and walking.  Objective: Vital signs in last 24 hours: Temp:  [97.6 F (36.4 C)-98.6 F (37 C)] 98.4 F (36.9 C) (07/04 0505) Pulse Rate:  [69-97] 90 (07/04 0505) Resp:  [11-18] 18 (07/04 0230) BP: (137-158)/(78-105) 150/89 mmHg (07/04 0505) SpO2:  [95 %-100 %] 100 % (07/04 0505)    Intake/Output from previous day: 07/03 0701 - 07/04 0700 In: 3801.7 [P.O.:55; I.V.:3646.7; IV Piggyback:100] Out: 3225 [Urine:3200; Blood:25] Intake/Output this shift:    General appearance: alert, cooperative and no distress Resp: clear to auscultation bilaterally GI: normal findings: soft, non-tender Incision/Wound: clean and dry  Lab Results:   Recent Labs  11/12/15 1006 11/13/15 0412  WBC 10.8* 11.2*  HGB 14.4  14.4 16.0  HCT 42.4  42.6 46.8  PLT 188 206   BMET  Recent Labs  11/12/15 1006  CREATININE 1.74*     Studies/Results: No results found.  Anti-infectives: Anti-infectives    Start     Dose/Rate Route Frequency Ordered Stop   11/12/15 0504  cefOXitin (MEFOXIN) 2 g in dextrose 5 % 50 mL IVPB     2 g 100 mL/hr over 30 Minutes Intravenous On call to O.R. 11/12/15 0504 11/12/15 0926      Assessment/Plan: s/p Procedure(s): LAPAROSCOPIC GASTRIC SLEEVE RESECTION WITH UPPER ENDO Doing well without apparent complication. Better pain control currently.  Plans discussed with patient and his wife and all questions answered   LOS: 1 day    Nylia Gavina T 11/13/2015

## 2015-11-14 DIAGNOSIS — K811 Chronic cholecystitis: Secondary | ICD-10-CM | POA: Diagnosis present

## 2015-11-14 LAB — CBC WITH DIFFERENTIAL/PLATELET
BASOS ABS: 0 10*3/uL (ref 0.0–0.1)
BASOS PCT: 0 %
Eosinophils Absolute: 0 10*3/uL (ref 0.0–0.7)
Eosinophils Relative: 0 %
HEMATOCRIT: 44.7 % (ref 39.0–52.0)
HEMOGLOBIN: 14.8 g/dL (ref 13.0–17.0)
Lymphocytes Relative: 19 %
Lymphs Abs: 1.8 10*3/uL (ref 0.7–4.0)
MCH: 30.3 pg (ref 26.0–34.0)
MCHC: 33.1 g/dL (ref 30.0–36.0)
MCV: 91.6 fL (ref 78.0–100.0)
MONO ABS: 1.3 10*3/uL — AB (ref 0.1–1.0)
Monocytes Relative: 14 %
NEUTROS ABS: 6 10*3/uL (ref 1.7–7.7)
NEUTROS PCT: 67 %
Platelets: 211 10*3/uL (ref 150–400)
RBC: 4.88 MIL/uL (ref 4.22–5.81)
RDW: 12.9 % (ref 11.5–15.5)
WBC: 9.1 10*3/uL (ref 4.0–10.5)

## 2015-11-14 MED ORDER — ALPRAZOLAM 0.5 MG PO TABS: 0.5000 mg | ORAL_TABLET | Freq: Two times a day (BID) | ORAL | Status: AC | PRN

## 2015-11-14 NOTE — Discharge Instructions (Signed)

## 2015-11-14 NOTE — Progress Notes (Signed)
Discharge instructions discussed with patient and family, verbalized understanding and agreement.  Prescriptions given to patient 

## 2015-11-14 NOTE — Discharge Summary (Signed)
Physician Discharge Summary  Patient ID: Martin Fox MRN: [id number] DOB/AGE: 43/10/1970 43 y.o.  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  PD 1 begun on liquids;  Xanax added for anxiety.  Ready for discharge on PD 2  Consults: none  Significant Diagnostic Studies: none    Discharge Exam: Blood pressure 162/81, pulse 78, temperature 97.5 F (36.4 C), temperature source Oral, resp. rate 18, height 5\' 10"  (1.778 m), weight 141.522 kg (312 lb), SpO2 96 %. Incisions OK.    Disposition: Final discharge disposition not confirmed  Discharge Instructions    Ambulate hourly while awake    Complete by:  As directed      Call MD for:  difficulty breathing, headache or visual disturbances    Complete by:  As directed      Call MD for:  persistant dizziness or light-headedness    Complete by:  As directed      Call MD for:  persistant nausea and vomiting    Complete by:  As directed      Call MD for:  redness, tenderness, or signs of infection (pain, swelling, redness, odor or green/yellow discharge around incision site)    Complete by:  As directed      Call MD for:  severe uncontrolled pain    Complete by:  As directed      Call MD for:  temperature >101 F    Complete by:  As directed      Diet bariatric full liquid    Complete by:  As directed      Incentive spirometry    Complete by:  As directed   Perform hourly while awake            Medication List    TAKE these medications        acetaminophen 500 MG tablet  Commonly known as:  TYLENOL  Take 500 mg by mouth every 6 (six) hours as needed for mild pain.     ALPRAZolam 0.5 MG tablet  Commonly known as:  XANAX  Take 1 tablet (0.5 mg total) by mouth 2 (two)  times daily as needed for anxiety.     amLODipine-valsartan 5-320 MG tablet  Commonly known as:  EXFORGE  Take 1 tablet by mouth daily.     LORazepam 0.5 MG tablet  Commonly known as:  ATIVAN  Take 0.5 mg by mouth every 8 (eight) hours as needed for anxiety.           Follow-up Information    Follow up with Valarie MerinoMARTIN,[name], MD.   Specialty:  General Surgery   Contact information:   [address] [place] [address] [phone]       Signed: Valarie MerinoMARTIN,[name] 11/14/2015, 8:52 AM

## 2015-11-19 ENCOUNTER — Telehealth (HOSPITAL_COMMUNITY): Payer: Self-pay

## 2015-11-19 NOTE — Telephone Encounter (Signed)
Made discharge phone call to patient per DROP protocol. Asking the following questions.    1. Do you have someone to care for you now that you are home?  yes 2. Are you having pain now that is not relieved by your pain medication?  No, i havent needed any tiday 3. Are you able to drink the recommended daily amount of fluids (48 ounces minimum/day) and protein (60-80 grams/day) as prescribed by the dietitian or nutritional counselor?  Fluids - doing the best I can; protein - getting those in 4. Are you taking the vitamins and minerals as prescribed?  yes 5. Do you have the "on call" number to contact your surgeon if you have a problem or question?  yes 6. Are your incisions free of redness, swelling or drainage? (If steri strips, address that these can fall off, shower as tolerated) yes 7. Have your bowels moved since your surgery?  If not, are you passing gas?  yes 8. Are you up and walking 3-4 times per day?  yes

## 2015-11-27 ENCOUNTER — Encounter: Payer: Self-pay | Admitting: Dietician

## 2015-11-27 ENCOUNTER — Encounter: Payer: BLUE CROSS/BLUE SHIELD | Attending: Surgery | Admitting: Dietician

## 2015-11-27 DIAGNOSIS — Z01818 Encounter for other preprocedural examination: Secondary | ICD-10-CM | POA: Insufficient documentation

## 2015-11-27 NOTE — Progress Notes (Signed)
  Follow-up visit:  2 Weeks Post-Operative Sleeve Gastrectomy Surgery  Medical Nutrition Therapy:  Appt start time: 1510 end time:  1555.  Primary concerns today: Post-operative Bariatric Surgery Nutrition Management. Returns with a 39.8 lb weight loss in the past month. Has started trying solid foods the past couple of days which is going well. Not able to tolerate protein shakes anymore. Was doing the Muscle Milk light but then started to not like them anymore.   Surgery date: 11/12/2015 Surgery type: [facility]lee[facility] lbs on 09/27/2015, 324.8 lbs on 10/22/15 Weight today: 285.0 lbs  Weight loss: 39.8 lbs  TANITA  BODY COMP RESULTS  10/22/15 11/27/15   BMI (kg/m^2) 46.6 40.9   Fat Mass (lbs) 144.8 113.2   Fat Free Mass (lbs) 180 171.8   Total Body Water (lbs) 143.2 130.2   Preferred Learning Style:   No preference indicated   Learning Readiness:   Ready  24-hr recall: B (AM): egg (6 g) Snk (AM): 2 cheese sticks throughout the day (12 g) L (PM): P3 ham, [place]urkey, and cheese over 2 meals (14 g)  Snk (PM): Triple oikos yogurt (15 g) D (PM): 1/2 muscle milk protein shake (7 g) Snk (PM): none  Fluid intake: 54 kool aid with splenda or decaf tea with splenda,  20 oz water Estimated total protein intake: 54 g   Medications: see list  Supplementation: taking  Using straws: No  Drinking while eating: Yes has sips Hair loss: No Carbonated beverages: No N/V/D/C: No Dumping syndrome: No  Recent physical activity:  [place]not sure how long)  Progress Towards Goal(s):  .  Handouts given during visit include:  Phase 3A High Protein    Nutritional Diagnosis:  [place]-3.3 Overweight/obesity related to past poor dietary habits and physical inactivity as evidenced by patient w/ recent sleeve gastrectomy surgery following dietary guidelines for continued weight loss.    Intervention:  Nutrition education/diet  advancement. Goals:  Follow Phase 3A: Soft High Protein Phase  Eat 3-6 small meals/snacks, every 3-5 hrs  Increase lean protein foods to meet 80g goal  Increase fluid intake to 64oz +  Avoid drinking 15 minutes before, during and 30 minutes after eating  Aim for >30 min of physical activity daily per MD  Teaching Method Utilized:  Visual Auditory Hands on  Barriers to learning/adherence to lifestyle change: none  Demonstrated degree of understanding via:  Teach Back   Monitoring/Evaluation:  Dietary intake, exercise, and body weight. Follow up in  6 weeks for 2 month post-op visit.

## 2015-11-27 NOTE — Patient Instructions (Signed)
Goals:  Follow Phase 3A: Soft High Protein Phase  Eat 3-6 small meals/snacks, every 3-5 hrs  Increase lean protein foods to meet 80g goal  Increase fluid intake to 64oz +  Avoid drinking 15 minutes before, during and 30 minutes after eating  Aim for >30 min of physical activity daily per MD  Surgery date: 11/12/2015 Surgery type: [facility]leeve gastrectomy [facility] lbs on 09/27/2015, 324.8 lbs on 10/22/15 Weight today: 285.0 lbs  Weight loss: 39.8 lbs  TANITA  BODY COMP RESULTS  10/22/15 11/27/15   BMI (kg/m^2) 46.6 40.9   Fat Mass (lbs) 144.8 113.2   Fat Free Mass (lbs) 180 171.8   Total Body Water (lbs) 143.2 130.2

## 2016-01-08 ENCOUNTER — Encounter: Payer: BLUE CROSS/BLUE SHIELD | Attending: Surgery | Admitting: Dietician

## 2016-01-08 ENCOUNTER — Encounter: Payer: Self-pay | Admitting: Dietician

## 2016-01-08 DIAGNOSIS — Z01818 Encounter for other preprocedural examination: Secondary | ICD-10-CM | POA: Insufficient documentation

## 2016-01-08 NOTE — Progress Notes (Signed)
Follow-up visit:  8 Weeks Post-Operative Sleeve Gastrectomy Surgery  Medical Nutrition Therapy:  Appt start time: 1030 end time:  1130  Primary concerns today: Post-operative Bariatric Surgery Nutrition Management. Returns with a 19.4 lb weight loss in the past 6 weeks. Cannot tolerate most solid foods and vomiting/regurgitating most protein foods. Having a gurgling/nauseas feeling when eating before he regurgitates foods. States he chews foods well and not having issues with reflux. Not taking medication for acid reflux or nausea.   Can tolerate soups or liquids or soft food. Can also eat large quantities of soup/liquid foods. Cannot stand taste of Muscle Milk protein anymore. Can tolerate Chick Fil A fried nuggets but grilled nuggets don't sit as well since they are drier. Can tolerate string cheese sometimes. Sometimes can tolerate pizza. Feeling more hungry now than before. Starting to become afraid of eating solid foods.   Does not like tomatoes, mushrooms, or mayonnaise.   Overall feeling "miserable" since he cannot eat the foods he likes. States that he regrets having the surgery. Trying to choose foods with protein but carbs are sitting better. Has a follow up with Dr. Daphine DeutscherMartin on Friday.   Surgery date: 11/12/2015 Surgery type: [facility]leeve gastrectomy [facility] lbs on 09/27/2015, 324.8 lbs on 10/22/15 Weight today: 265.6 lbs  Weight loss: 19.4 lbs Total weight: 59.2 lbs  TANITA  BODY COMP RESULTS  10/22/15 11/27/15 01/08/16    BMI (kg/m^2) 46.6 40.9 38.1   Fat Mass (lbs) 144.8 113.2 93.4   Fat Free Mass (lbs) 180 171.8 172.2   Total Body Water (lbs) 143.2 130.2 125.6   Preferred Learning Style:   No preference indicated   Learning Readiness:   Ready  24-hr recall: B (AM): 1 cup cheerios with 2% milk  (10 g) Snk (AM): none or more cereal  (0-10 g) Snk (AM):Triple Zero yogurt (15 g) L (PM): campbells vegetable or progressio steak and potato soup (10 g)  Snk (PM):  none or soup or cheerios/special k protein (0-10 g) Snk (AM):Triple Zero yogurt (15 g) D (PM): soup or cereal (10 g) Snk (PM): low sugar ice cream  Fluid intake:  More than 64 oz kool aid with splenda or decaf tea with splenda,  20 oz water Estimated total protein intake: 54 g   Medications: see list  Supplementation: taking one MVI (centrum) per day, 1-2 calcium chews, Vitamin B12 most day   Using straws: not regularly  Drinking while eating: Yes has sips Hair loss: No Carbonated beverages: No N/V/D/C: vomits when eating solid protein food Dumping syndrome: No  Recent physical activity:  [place] and [place]not sure how long)  Progress Towards Goal(s):  .  Handouts given during visit include:  None  Supplements given during visit:  2 Unjury Chicken Soup, lot [phone], exp 8/18  2 Unflavored Unjury, lot # [id number], lot # 7/18   Nutritional Diagnosis:  [place]-3.3 Overweight/obesity related to past poor dietary habits and physical inactivity as evidenced by patient w/ recent sleeve gastrectomy surgery following dietary guidelines for continued weight loss.    Intervention:  Nutrition education/diet advancement. Goals:  Follow Phase 3A: Soft High Protein Phase  Eat 3-6 small meals/snacks, every 3-5 hrs  Increase lean protein foods to meet 80g goal  Increase fluid intake to 64oz +  Avoid drinking 15 minutes before, during and 30 minutes after eating  Aim for >30 min of physical activity daily per MD  Try Unjury chicken soup or unflavored (Genepro unflavored  another option)  Look at [employer] for [facility] ideas  Try to eat mostly protein foods - beans, chili, cheese, yogurt, eggs with milk and cheese, lunch meat with cheese, turkey/chicken with gravy or another sauce (try low fat cream of chicken soup)  Switch to skim milk   Limit carbs when possible - bread, potatoes, cereal   Teaching Method Utilized:  Visual Auditory Hands  on  Barriers to learning/adherence to lifestyle change: none  Demonstrated degree of understanding via:  Teach Back   Monitoring/Evaluation:  Dietary intake, exercise, and body weight. Follow up in 4 weeks for 3 month post-op visit.

## 2016-01-08 NOTE — Patient Instructions (Addendum)
Goals:  Follow Phase 3A: Soft High Protein Phase  Eat 3-6 small meals/snacks, every 3-5 hrs  Increase lean protein foods to meet 80g goal  Increase fluid intake to 64oz +  Avoid drinking 15 minutes before, during and 30 minutes after eating  Aim for >30 min of physical activity daily per MD  Try Unjury chicken soup or unflavored (Genepro unflavored another option)  Look at [employer] website for [facility] Healthcare[facility] soup ideas  Try to eat mostly protein foods - beans, chili, cheese, yogurt, eggs with milk and cheese, lunch meat with cheese, turkey/chicken with gravy or another sauce (try low fat cream of chicken soup)  Switch to skim milk   Limit carbs when possible - bread, potatoes, cereal     Surgery date: 11/12/2015 Surgery type: Sleeve gastrectomy [facility] lbs on 09/27/2015, 324.8 lbs on 10/22/15 Weight today: 265.6 lbs  Weight loss: 19.4 lbs Total weight: 59.2 lbs  TANITA  BODY COMP RESULTS  10/22/15 11/27/15 01/08/16    BMI (kg/m^2) 46.6 40.9 38.1   Fat Mass (lbs) 144.8 113.2 93.4   Fat Free Mass (lbs) 180 171.8 172.2   Total Body Water (lbs) 143.2 130.2 125.6

## 2016-02-05 ENCOUNTER — Encounter: Payer: BLUE CROSS/BLUE SHIELD | Attending: Surgery | Admitting: Dietician

## 2016-02-05 DIAGNOSIS — Z01818 Encounter for other preprocedural examination: Secondary | ICD-10-CM | POA: Insufficient documentation

## 2016-02-05 NOTE — Patient Instructions (Addendum)
.  Goals:  Eat 3-6 small meals/snacks, every 3-5 hrs  Increase lean protein foods to meet 80g goal  Increase fluid intake to 64oz +  Avoid drinking 15 minutes before, during and 30 minutes after eating  Aim for >30 min of physical activity daily per MD  Limit carbs and sweets   Try to eat 3-4 oz piece of protein for lunch or dinner with vegetables (or both)  For vegetables have romaine lettuce, carrots, or green beans to lunch and dinner meal   Have no more than 3 yogurts per day     Surgery date: 11/12/2015 Surgery type: [facility]leeve gastrectomy [facility] lbs on 09/27/2015, 324.8 lbs on 10/22/15 Weight today: 252.6 lbs  Weight loss: 13 lbs Total weight: 72.2 lbs  TANITA  BODY COMP RESULTS  10/22/15 11/27/15 01/08/16  02/05/16   BMI (kg/m^2) 46.6 40.9 38.1 36.2   Fat Mass (lbs) 144.8 113.2 93.4 87.2   Fat Free Mass (lbs) 180 171.8 172.2 165.4   Total Body Water (lbs) 143.2 130.2 125.6 120.0

## 2016-02-05 NOTE — Progress Notes (Signed)
Follow-up visit:  12 Weeks Post-Operative Sleeve Gastrectomy Surgery  Medical Nutrition Therapy:  Appt start time: 930 end time:  1015  Primary concerns today: Post-operative Bariatric Surgery Nutrition Management. Returns with a 13 lb weight loss in the past 4 weeks. Able to tolerate small amounts chicken and steak slowly now. Doing mostly yogurt Greek 100 (5-6 per day) for protein. No longer having nausea or regurgitating foods.    Gets hungry pretty quickly (every 2 hours). Can fill hunger with drinking.   Does not like tomatoes, mushrooms, eggs (anymore) or mayonnaise. Likes romaine lettuce, carrots, green beans as far as vegetables are concerned.   Overall feeling better than last time and no longer regretting the surgery.  Enjoying riding his bike now.  Surgery date: 11/12/2015 Surgery type: Sleeve gastrectomy Start weight at [facility]: 323 lbs on 09/27/2015, 324.8 lbs on 10/22/15 Weight today: 252.6 lbs  Weight loss: 13 lbs Total weight: 72.2 lbs  Weight loss goal is 200 lbs  TANITA  BODY COMP RESULTS  10/22/15 11/27/15 01/08/16  02/05/16   BMI (kg/m^2) 46.6 40.9 38.1 36.2   Fat Mass (lbs) 144.8 113.2 93.4 87.2   Fat Free Mass (lbs) 180 171.8 172.2 165.4   Total Body Water (lbs) 143.2 130.2 125.6 120.0    Preferred Learning Style:   No preference indicated   Learning Readiness:   Ready  24-hr recall: B (930-100AM): yogurt (13-15 g) Snk (AM): yogurt (13-15 g) L (1130PM): 4-6 fried Chick [employer] nuggets or progresso steak and potato soup or beef stew or 1/2 - whole burger (10-21 g)  Snk (PM): yogurt (13-15 g) Snk (AM):yogurt or special k protein bar (7-15 g) D (PM): stoffers meat lasagna with no noodles or hot dog with chili 3-4 oz chicken (14-28 g) Snk (PM): yogurt (13-15 g)  Snacks on cheese throughout the day.   Fluid intake:  More than 64 oz kool aid with splenda or decaf tea with splenda,  20 oz water Estimated total protein intake: 83-124 g   Medications: see list   Supplementation: having trouble remembering getting in vitamins/calcium throughout day, taking morning vitamin and V12   Using straws: Yes, takes sips which helps him get in fluid Drinking while eating: Yes has sips Hair loss: No Carbonated beverages: No N/V/D/C: No Dumping syndrome: No  Recent physical activity:  Rides bikes around 4 x week for 20-25 minutes   Handouts given during visit include:  None  Supplements given during visit:  none   Nutritional Diagnosis:  [place]-3.3 Overweight/obesity related to past poor dietary habits and physical inactivity as evidenced by patient w/ recent sleeve gastrectomy surgery following dietary guidelines for continued weight loss.    Intervention:  Nutrition education/diet advancement. .Goals:  Eat 3-6 small meals/snacks, every 3-5 hrs  Increase lean protein foods to meet 80g goal  Increase fluid intake to 64oz +  Avoid drinking 15 minutes before, during and 30 minutes after eating  Aim for >30 min of physical activity daily per MD  Limit carbs and sweets   Try to eat 3-4 oz piece of protein for lunch or dinner with vegetables (or both)  For vegetables have romaine lettuce, carrots, or green beans to lunch and dinner meal   Have no more than 3 yogurts per day   Teaching Method Utilized:  Visual Auditory Hands on  Barriers to learning/adherence to lifestyle change: none  Demonstrated degree of understanding via:  Teach Back   Monitoring/Evaluation:  Dietary intake, exercise, and body weight. Follow  up in 2 months for 5 month post-op visit.

## 2016-02-26 ENCOUNTER — Encounter: Payer: BLUE CROSS/BLUE SHIELD | Attending: Surgery | Admitting: Dietician

## 2016-02-26 ENCOUNTER — Ambulatory Visit: Payer: BLUE CROSS/BLUE SHIELD | Admitting: Dietician

## 2016-02-26 DIAGNOSIS — Z01818 Encounter for other preprocedural examination: Secondary | ICD-10-CM | POA: Insufficient documentation

## 2016-02-26 NOTE — Progress Notes (Signed)
  Weight Check:  Surgery date: 11/12/2015 Surgery type: Sleeve gastrectomy [facility]: [facility] lbs on 09/27/2015, 324.8 lbs on 10/22/15 Weight today: 241.4 lbs  Weight loss: 11.2 lbs Total weight: 83.4 lbs  Weight loss goal is 200 lbs  TANITA  BODY COMP RESULTS  10/22/15 11/27/15 01/08/16  02/05/16 02/26/16   BMI (kg/m^2) 46.6 40.9 38.1 36.2 34.6   Fat Mass (lbs) 144.8 113.2 93.4 87.2 78.2   Fat Free Mass (lbs) 180 171.8 172.2 165.4 163.2   Total Body Water (lbs) 143.2 130.2 125.6 120.0 116.8

## 2016-04-15 ENCOUNTER — Encounter: Payer: BLUE CROSS/BLUE SHIELD | Attending: Surgery | Admitting: Dietician

## 2016-04-15 DIAGNOSIS — Z01818 Encounter for other preprocedural examination: Secondary | ICD-10-CM | POA: Diagnosis present

## 2016-04-15 NOTE — Progress Notes (Signed)
   Follow-up visit:  5 Months Post-Operative Sleeve Gastrectomy Surgery  Medical Nutrition Therapy:  Appt start time: 476 end time:  915  Primary concerns today: Post-operative Bariatric Surgery Nutrition Management. Returns with a 17 lb weight loss in the past 6 weeks. Met first goal of losing 100 lbs and next goal is having weight below 200 lbs. Has nasal surgery last week. Had vitamin labs done at PCP recently and he stated they were all normal.    Able to tolerate protein foods well. Had first "cheat" meal recently. Eats every 2 hours which is when he gets hungry. Feels like he eats "normal" now. Eats a lot of restaurant meals.   Satisfied with weight, foods he is eating, and activity level. Will call the office if he needs another follow up appointment in the future.   Surgery date: 11/12/2015 Surgery type: Sleeve gastrectomy Start weight at [facility]: 323 lbs on 09/27/2015, 324.8 lbs on 10/22/15 Weight today: 224.6 lbs  Weight loss: 17 lbs Total weight: 100.4 lbs  Weight loss goal is 200 lbs  TANITA  BODY COMP RESULTS  10/22/15 11/27/15 01/08/16  02/05/16 02/26/16 04/15/16   BMI (kg/m^2) 46.6 40.9 38.1 36.2 34.6 32.2   Fat Mass (lbs) 144.8 113.2 93.4 87.2 78.2 64.6   Fat Free Mass (lbs) 180 171.8 172.2 165.4 163.2 160.0   Total Body Water (lbs) 143.2 130.2 125.6 120.0 116.8 112.2   Preferred Learning Style:   No preference indicated   Learning Readiness:   Ready  24-hr recall: B (930-100AM): yogurt or Raisin Bran (6-15 g) Snk (AM): cheese (7 g) L (1130PM): 4-6 fried Chick Fil A nuggets or progresso steak and potato soup or beef stew or 1/2 - whole burger (10-21 g)  Snk (PM): yogurt or cheese or nuts (13-15 g) Snk (AM):yogurt or special k protein bar (7-15 g) D (PM): 3-4 roast beef, chicken, pork, ribs with a "bite of macaroni" green beans and corn (21-28 g) Snk (PM): yogurt (13-15 g)  Snacks on cheese throughout the day.   Fluid intake:  More than 64 oz kool aid with splenda or  decaf tea with splenda,  20 oz water, G2 sometimes  Estimated total protein intake: 83-124 g   Medications: see list  Supplementation: Taking  Using straws: Yes, takes sips which helps him get in fluid (states it doesn't bother him) Drinking while eating: Yes has sips Hair loss: had some thinning, not sure if it's from the surgery  Carbonated beverages: No N/V/D/C: No Dumping syndrome: No  Recent physical activity:  Rides bikes around 4 x week for 20-25 minutes, walks 2 x week for 30 minutes and some with dog each day   Handouts given during visit include:  None  Supplements given during visit:  none   Nutritional Diagnosis:  [place]-3.3 Overweight/obesity related to past poor dietary habits and physical inactivity as evidenced by patient w/ recent sleeve gastrectomy surgery following dietary guidelines for continued weight loss.    Intervention:  Nutrition education/diet encouragement. Encouraged patient to continue to do what he is doing with his diet and to call me if he needs another follow up appointment.   Teaching Method Utilized:  Visual Auditory Hands on  Barriers to learning/adherence to lifestyle change: none  Demonstrated degree of understanding via:  Teach Back   Monitoring/Evaluation:  Dietary intake, exercise, and body weight. Follow up  prn

## 2016-04-15 NOTE — Patient Instructions (Addendum)
.  Goals: Keep up the good work!    Surgery date: 11/12/2015 Surgery type: Sleeve gastrectomy [facility]: [facility] lbs on 09/27/2015, 324.8 lbs on 10/22/15 Weight today: 224.6 lbs  Weight loss: 17 lbs Total weight: 100.4 lbs  TANITA  BODY COMP RESULTS  10/22/15 11/27/15 01/08/16  02/05/16 02/26/16 04/15/16   BMI (kg/m^2) 46.6 40.9 38.1 36.2 34.6 32.2   Fat Mass (lbs) 144.8 113.2 93.4 87.2 78.2 64.6   Fat Free Mass (lbs) 180 171.8 172.2 165.4 163.2 160.0   Total Body Water (lbs) 143.2 130.2 125.6 120.0 116.8 112.2

## 2017-06-01 ENCOUNTER — Encounter (HOSPITAL_COMMUNITY): Payer: Self-pay

## 2017-09-22 IMAGING — DX DG CHEST 2V
2 series · 3 of 3 positions shown · non-contrast
Comparison: None.

CLINICAL DATA: Preop for gastric sleeve surgery, history of
diabetes and hypertension

EXAM:
CHEST  2 VIEW

[chest pa]
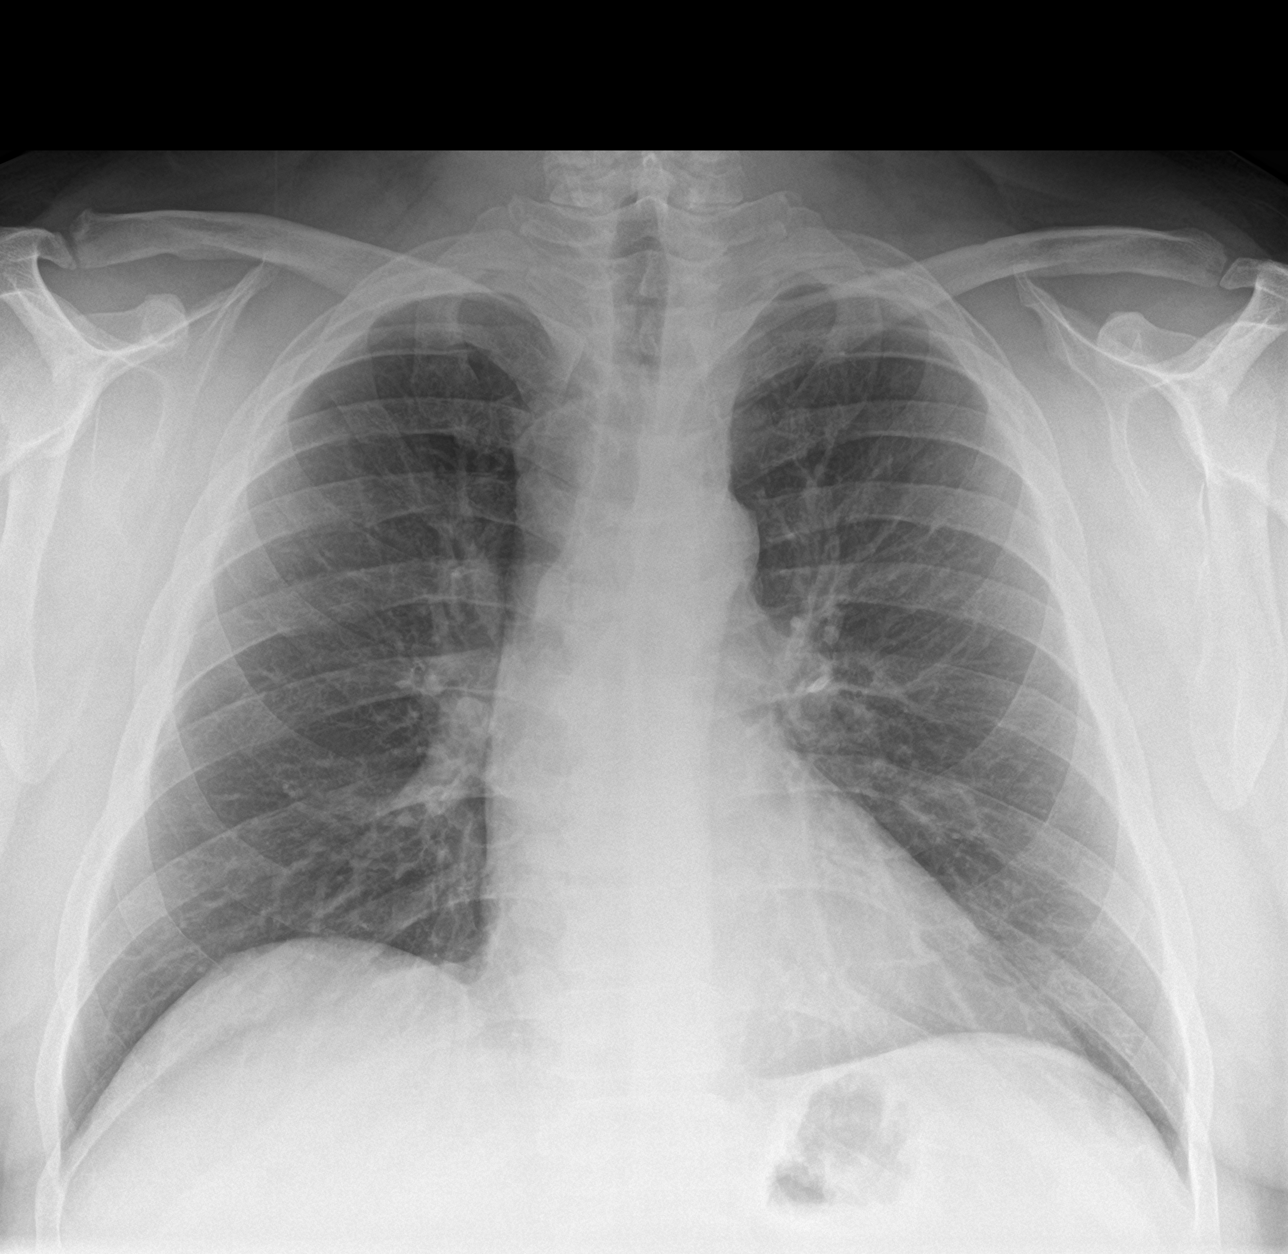

[Series 2: chest lat · 0.14mm/px · 2 of 2 slices shown]
[im 1/2]
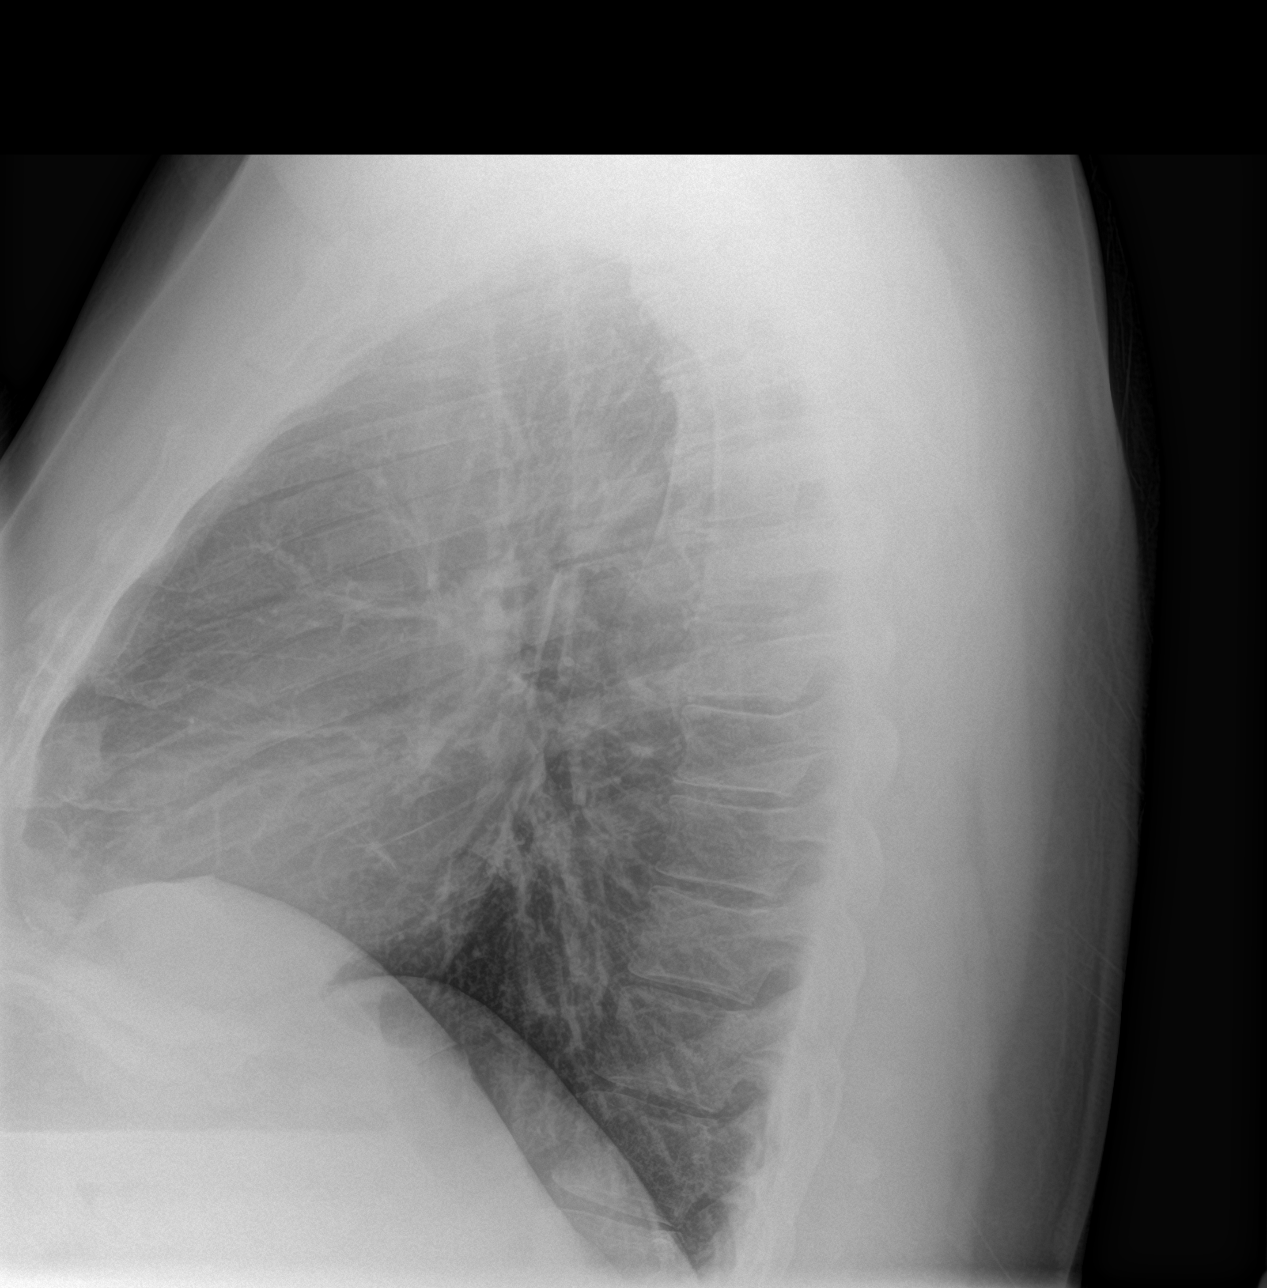
[im 2/2]
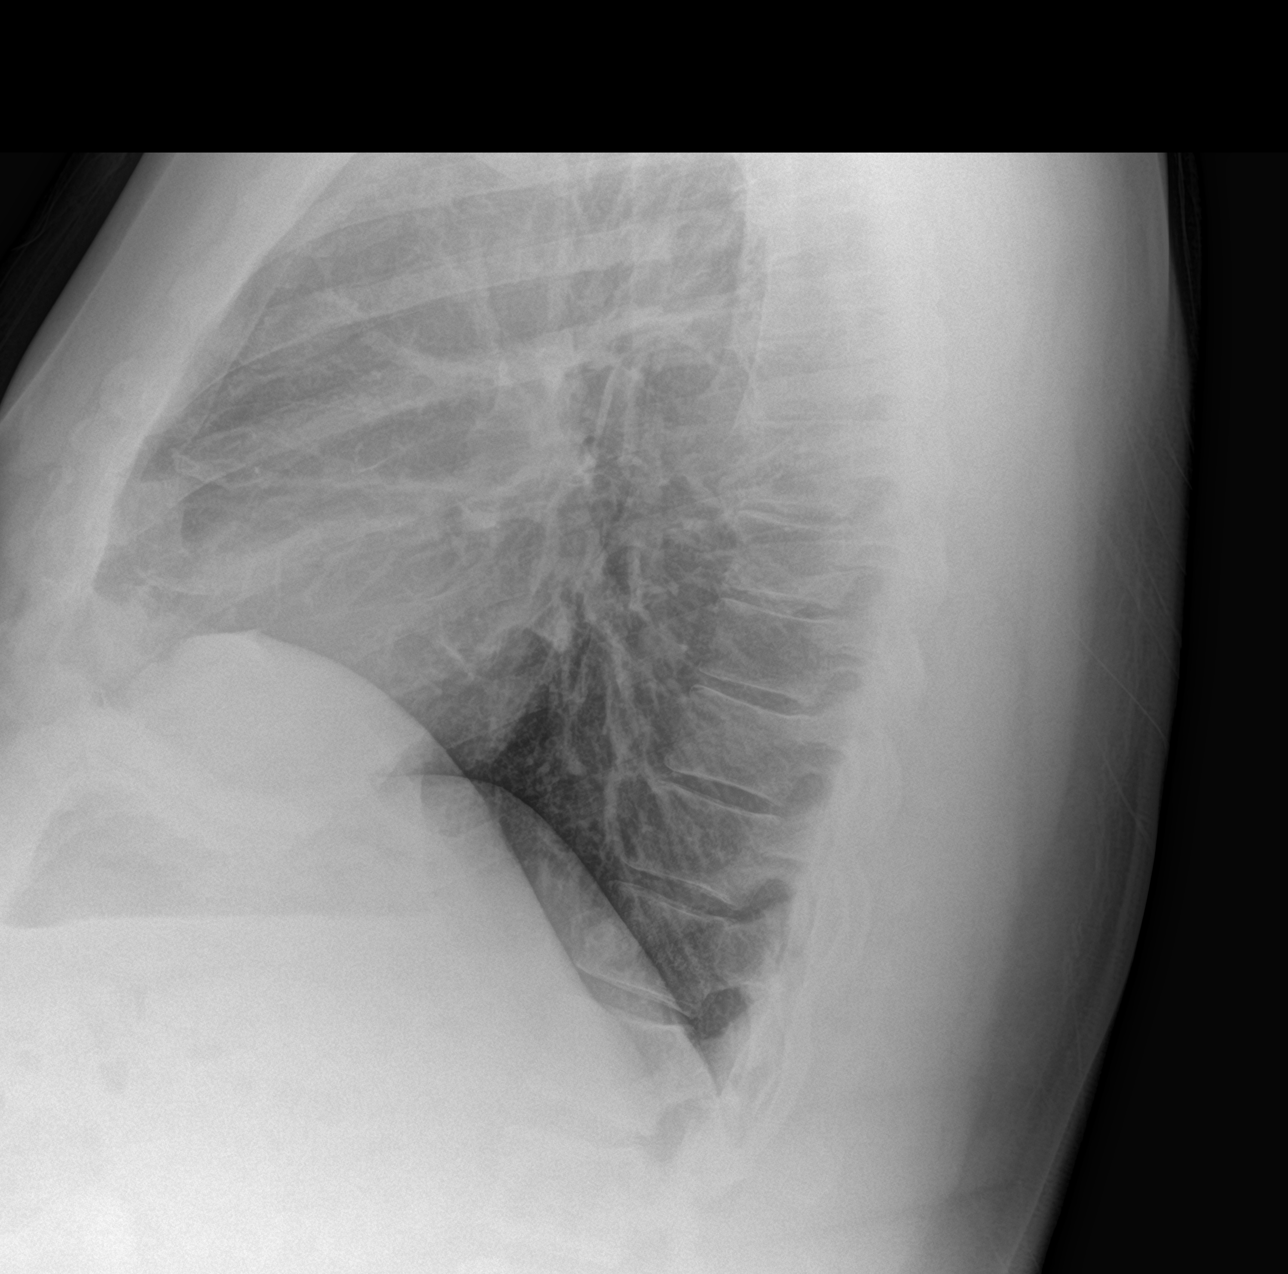

[3 of 3 positions shown; findings below may reference images not displayed]

FINDINGS: No active infiltrate or effusion is seen. Mediastinal and hilar
contours are unremarkable. The heart is within normal limits in
size. No bony abnormality is seen.
IMPRESSION: No active cardiopulmonary disease.

## 2023-02-23 ENCOUNTER — Other Ambulatory Visit: Payer: Self-pay

## 2023-02-23 ENCOUNTER — Other Ambulatory Visit (HOSPITAL_BASED_OUTPATIENT_CLINIC_OR_DEPARTMENT_OTHER): Payer: Self-pay

## 2023-02-23 MED ORDER — WEGOVY 1.7 MG/0.75ML ~~LOC~~ SOAJ: 1.7000 mg | SUBCUTANEOUS | 0 refills | Status: DC

## 2023-02-23 MED ORDER — WEGOVY 1 MG/0.5ML ~~LOC~~ SOAJ: 1.0000 mg | SUBCUTANEOUS | 0 refills | Status: DC

## 2023-02-23 MED ORDER — WEGOVY 0.5 MG/0.5ML ~~LOC~~ SOAJ
0.5000 mg | SUBCUTANEOUS | 0 refills | Status: DC
  Filled 2023-02-23: qty 2, 28d supply, fill #0

## 2023-02-26 ENCOUNTER — Other Ambulatory Visit (HOSPITAL_BASED_OUTPATIENT_CLINIC_OR_DEPARTMENT_OTHER): Payer: Self-pay

## 2023-03-03 ENCOUNTER — Other Ambulatory Visit (HOSPITAL_BASED_OUTPATIENT_CLINIC_OR_DEPARTMENT_OTHER): Payer: Self-pay

## 2023-03-03 MED ORDER — ZEPBOUND 5 MG/0.5ML ~~LOC~~ SOAJ
5.0000 mg | SUBCUTANEOUS | 1 refills | Status: DC
  Filled 2023-03-03: qty 2, 28d supply, fill #0

## 2023-03-04 ENCOUNTER — Other Ambulatory Visit (HOSPITAL_BASED_OUTPATIENT_CLINIC_OR_DEPARTMENT_OTHER): Payer: Self-pay

## 2023-04-01 ENCOUNTER — Other Ambulatory Visit: Payer: Self-pay

## 2023-04-01 ENCOUNTER — Other Ambulatory Visit (HOSPITAL_BASED_OUTPATIENT_CLINIC_OR_DEPARTMENT_OTHER): Payer: Self-pay

## 2023-04-01 MED ORDER — ZEPBOUND 7.5 MG/0.5ML ~~LOC~~ SOAJ
7.5000 mg | SUBCUTANEOUS | 0 refills | Status: DC
  Filled 2023-04-01: qty 2, 28d supply, fill #0

## 2023-04-01 MED ORDER — ZEPBOUND 10 MG/0.5ML ~~LOC~~ SOAJ
10.0000 mg | SUBCUTANEOUS | 0 refills | Status: DC
  Filled 2023-05-12: qty 2, 28d supply, fill #0

## 2023-04-13 ENCOUNTER — Other Ambulatory Visit (HOSPITAL_BASED_OUTPATIENT_CLINIC_OR_DEPARTMENT_OTHER): Payer: Self-pay

## 2023-05-12 ENCOUNTER — Other Ambulatory Visit (HOSPITAL_BASED_OUTPATIENT_CLINIC_OR_DEPARTMENT_OTHER): Payer: Self-pay

## 2023-07-07 ENCOUNTER — Other Ambulatory Visit (HOSPITAL_BASED_OUTPATIENT_CLINIC_OR_DEPARTMENT_OTHER): Payer: Self-pay

## 2023-07-07 MED ORDER — ZEPBOUND 10 MG/0.5ML ~~LOC~~ SOAJ
10.0000 mg | SUBCUTANEOUS | 0 refills | Status: AC
  Filled 2023-07-07: qty 2, 28d supply, fill #0

## 2023-08-25 ENCOUNTER — Other Ambulatory Visit (HOSPITAL_BASED_OUTPATIENT_CLINIC_OR_DEPARTMENT_OTHER): Payer: Self-pay

## 2023-08-26 ENCOUNTER — Other Ambulatory Visit (HOSPITAL_BASED_OUTPATIENT_CLINIC_OR_DEPARTMENT_OTHER): Payer: Self-pay

## 2023-08-26 MED ORDER — ZEPBOUND 2.5 MG/0.5ML ~~LOC~~ SOAJ
2.5000 mg | SUBCUTANEOUS | 0 refills | Status: DC
  Filled 2023-08-26 – 2023-09-08 (×3): qty 2, 28d supply, fill #0

## 2023-08-27 ENCOUNTER — Other Ambulatory Visit (HOSPITAL_BASED_OUTPATIENT_CLINIC_OR_DEPARTMENT_OTHER): Payer: Self-pay

## 2023-08-28 ENCOUNTER — Other Ambulatory Visit (HOSPITAL_BASED_OUTPATIENT_CLINIC_OR_DEPARTMENT_OTHER): Payer: Self-pay

## 2023-08-31 ENCOUNTER — Other Ambulatory Visit (HOSPITAL_BASED_OUTPATIENT_CLINIC_OR_DEPARTMENT_OTHER): Payer: Self-pay

## 2023-09-01 ENCOUNTER — Other Ambulatory Visit (HOSPITAL_BASED_OUTPATIENT_CLINIC_OR_DEPARTMENT_OTHER): Payer: Self-pay

## 2023-09-02 ENCOUNTER — Other Ambulatory Visit (HOSPITAL_BASED_OUTPATIENT_CLINIC_OR_DEPARTMENT_OTHER): Payer: Self-pay

## 2023-09-08 ENCOUNTER — Other Ambulatory Visit: Payer: Self-pay

## 2023-09-08 ENCOUNTER — Other Ambulatory Visit (HOSPITAL_BASED_OUTPATIENT_CLINIC_OR_DEPARTMENT_OTHER): Payer: Self-pay

## 2023-09-29 ENCOUNTER — Other Ambulatory Visit (HOSPITAL_BASED_OUTPATIENT_CLINIC_OR_DEPARTMENT_OTHER): Payer: Self-pay

## 2023-09-29 MED ORDER — WEGOVY 0.25 MG/0.5ML ~~LOC~~ SOAJ
0.2500 mg | SUBCUTANEOUS | 0 refills | Status: AC
  Filled 2023-09-29 – 2023-12-04 (×2): qty 2, 28d supply, fill #0

## 2023-10-07 ENCOUNTER — Other Ambulatory Visit (HOSPITAL_BASED_OUTPATIENT_CLINIC_OR_DEPARTMENT_OTHER): Payer: Self-pay

## 2023-10-09 ENCOUNTER — Other Ambulatory Visit (HOSPITAL_BASED_OUTPATIENT_CLINIC_OR_DEPARTMENT_OTHER): Payer: Self-pay

## 2023-12-04 ENCOUNTER — Other Ambulatory Visit (HOSPITAL_BASED_OUTPATIENT_CLINIC_OR_DEPARTMENT_OTHER): Payer: Self-pay

## 2024-01-06 ENCOUNTER — Other Ambulatory Visit (HOSPITAL_BASED_OUTPATIENT_CLINIC_OR_DEPARTMENT_OTHER): Payer: Self-pay

## 2024-01-06 MED ORDER — WEGOVY 0.5 MG/0.5ML ~~LOC~~ SOAJ
0.5000 mg | SUBCUTANEOUS | 0 refills | Status: AC
  Filled 2024-01-06: qty 2, 28d supply, fill #0

## 2024-02-02 ENCOUNTER — Other Ambulatory Visit (HOSPITAL_BASED_OUTPATIENT_CLINIC_OR_DEPARTMENT_OTHER): Payer: Self-pay

## 2024-02-02 MED ORDER — WEGOVY 1 MG/0.5ML ~~LOC~~ SOAJ
1.0000 mg | SUBCUTANEOUS | 0 refills | Status: AC
  Filled 2024-02-02: qty 2, 28d supply, fill #0

## 2024-02-23 ENCOUNTER — Other Ambulatory Visit (HOSPITAL_BASED_OUTPATIENT_CLINIC_OR_DEPARTMENT_OTHER): Payer: Self-pay

## 2024-02-23 MED ORDER — WEGOVY 1.7 MG/0.75ML ~~LOC~~ SOAJ
SUBCUTANEOUS | 0 refills | Status: DC
  Filled 2024-03-02: qty 3, 28d supply, fill #0

## 2024-03-02 ENCOUNTER — Other Ambulatory Visit (HOSPITAL_BASED_OUTPATIENT_CLINIC_OR_DEPARTMENT_OTHER): Payer: Self-pay

## 2024-04-13 ENCOUNTER — Other Ambulatory Visit (HOSPITAL_BASED_OUTPATIENT_CLINIC_OR_DEPARTMENT_OTHER): Payer: Self-pay

## 2024-04-13 MED ORDER — WEGOVY 2.4 MG/0.75ML ~~LOC~~ SOAJ
2.4000 mg | SUBCUTANEOUS | 0 refills | Status: AC
  Filled 2024-04-13: qty 3, 28d supply, fill #0

## 2024-05-10 ENCOUNTER — Other Ambulatory Visit (HOSPITAL_BASED_OUTPATIENT_CLINIC_OR_DEPARTMENT_OTHER): Payer: Self-pay

## 2024-05-10 MED ORDER — WEGOVY 1.7 MG/0.75ML ~~LOC~~ SOAJ
1.7000 mg | SUBCUTANEOUS | 0 refills | Status: AC
  Filled 2024-05-10: qty 3, 28d supply, fill #0
